# Patient Record
Sex: Female | Born: 1975 | Race: Black or African American | Hispanic: No | Marital: Single | Smoking: Never smoker
Health system: Southern US, Community
[De-identification: ages and names within clinical notes are randomized; demographics above are authoritative.]

## PROBLEM LIST (undated history)

## (undated) DIAGNOSIS — I10 Essential (primary) hypertension: Secondary | ICD-10-CM

## (undated) DIAGNOSIS — I82409 Acute embolism and thrombosis of unspecified deep veins of unspecified lower extremity: Secondary | ICD-10-CM

## (undated) DIAGNOSIS — K449 Diaphragmatic hernia without obstruction or gangrene: Secondary | ICD-10-CM

## (undated) DIAGNOSIS — I81 Portal vein thrombosis: Secondary | ICD-10-CM

## (undated) HISTORY — PX: DILATION AND CURETTAGE OF UTERUS: SHX78

## (undated) HISTORY — PX: APPENDECTOMY: SHX54

## (undated) HISTORY — PX: LAPAROSCOPIC GASTRIC BAND REMOVAL WITH LAPAROSCOPIC GASTRIC SLEEVE RESECTION: SHX6498

---

## 2006-12-05 ENCOUNTER — Ambulatory Visit: Payer: Self-pay | Admitting: Obstetrics & Gynecology

## 2006-12-19 ENCOUNTER — Other Ambulatory Visit: Admission: RE | Admit: 2006-12-19 | Discharge: 2006-12-19 | Payer: Self-pay | Admitting: Obstetrics & Gynecology

## 2006-12-19 ENCOUNTER — Encounter: Payer: Self-pay | Admitting: Obstetrics and Gynecology

## 2006-12-19 ENCOUNTER — Ambulatory Visit: Payer: Self-pay | Admitting: *Deleted

## 2007-03-07 ENCOUNTER — Ambulatory Visit: Payer: Self-pay | Admitting: Obstetrics & Gynecology

## 2007-04-01 ENCOUNTER — Ambulatory Visit: Payer: Self-pay | Admitting: Obstetrics & Gynecology

## 2007-04-01 ENCOUNTER — Encounter: Payer: Self-pay | Admitting: Obstetrics & Gynecology

## 2007-04-01 ENCOUNTER — Ambulatory Visit (HOSPITAL_COMMUNITY): Admission: RE | Admit: 2007-04-01 | Discharge: 2007-04-01 | Payer: Self-pay | Admitting: Obstetrics & Gynecology

## 2007-04-24 ENCOUNTER — Ambulatory Visit: Payer: Self-pay | Admitting: Obstetrics & Gynecology

## 2008-12-10 ENCOUNTER — Ambulatory Visit: Payer: Self-pay | Admitting: Obstetrics and Gynecology

## 2008-12-11 ENCOUNTER — Encounter: Payer: Self-pay | Admitting: Obstetrics & Gynecology

## 2008-12-11 LAB — CONVERTED CEMR LAB
FSH: 5.2 milliintl units/mL
Glucose, Bld: 92 mg/dL (ref 70–99)
LH: 8.2 milliintl units/mL
TSH: 1.458 microintl units/mL (ref 0.350–4.500)

## 2008-12-25 ENCOUNTER — Ambulatory Visit: Payer: Self-pay | Admitting: Obstetrics and Gynecology

## 2010-04-27 ENCOUNTER — Emergency Department (HOSPITAL_BASED_OUTPATIENT_CLINIC_OR_DEPARTMENT_OTHER)
Admission: EM | Admit: 2010-04-27 | Discharge: 2010-04-27 | Payer: Self-pay | Source: Home / Self Care | Admitting: Emergency Medicine

## 2010-04-28 ENCOUNTER — Emergency Department (HOSPITAL_BASED_OUTPATIENT_CLINIC_OR_DEPARTMENT_OTHER)
Admission: EM | Admit: 2010-04-28 | Discharge: 2010-04-28 | Payer: Self-pay | Source: Home / Self Care | Admitting: Emergency Medicine

## 2010-04-30 ENCOUNTER — Emergency Department (HOSPITAL_BASED_OUTPATIENT_CLINIC_OR_DEPARTMENT_OTHER)
Admission: EM | Admit: 2010-04-30 | Discharge: 2010-04-30 | Payer: Self-pay | Source: Home / Self Care | Admitting: Emergency Medicine

## 2010-06-07 ENCOUNTER — Emergency Department (HOSPITAL_BASED_OUTPATIENT_CLINIC_OR_DEPARTMENT_OTHER)
Admission: EM | Admit: 2010-06-07 | Discharge: 2010-06-07 | Payer: Self-pay | Source: Home / Self Care | Admitting: Emergency Medicine

## 2010-06-09 ENCOUNTER — Emergency Department (HOSPITAL_BASED_OUTPATIENT_CLINIC_OR_DEPARTMENT_OTHER)
Admission: EM | Admit: 2010-06-09 | Discharge: 2010-06-09 | Payer: Self-pay | Source: Home / Self Care | Admitting: Emergency Medicine

## 2010-07-10 ENCOUNTER — Emergency Department (HOSPITAL_BASED_OUTPATIENT_CLINIC_OR_DEPARTMENT_OTHER)
Admission: EM | Admit: 2010-07-10 | Discharge: 2010-07-10 | Disposition: A | Discharge status: Other Acute Inpatient Hospital | Payer: Medicaid Other | Attending: Emergency Medicine | Admitting: Emergency Medicine

## 2010-07-10 ENCOUNTER — Emergency Department (INDEPENDENT_AMBULATORY_CARE_PROVIDER_SITE_OTHER): Payer: Medicaid Other

## 2010-07-10 DIAGNOSIS — I10 Essential (primary) hypertension: Secondary | ICD-10-CM | POA: Insufficient documentation

## 2010-07-10 DIAGNOSIS — K219 Gastro-esophageal reflux disease without esophagitis: Secondary | ICD-10-CM | POA: Insufficient documentation

## 2010-07-10 DIAGNOSIS — Z79899 Other long term (current) drug therapy: Secondary | ICD-10-CM | POA: Insufficient documentation

## 2010-07-10 DIAGNOSIS — R079 Chest pain, unspecified: Secondary | ICD-10-CM | POA: Insufficient documentation

## 2010-07-10 LAB — BASIC METABOLIC PANEL
Calcium: 8.3 mg/dL — ABNORMAL LOW (ref 8.4–10.5)
Creatinine, Ser: 0.8 mg/dL (ref 0.4–1.2)
GFR calc non Af Amer: >60 mL/min (ref >60)
Glucose, Bld: 83 mg/dL (ref 70–99)
Sodium: 140 mEq/L (ref 135–145)

## 2010-07-10 LAB — URINALYSIS, ROUTINE W REFLEX MICROSCOPIC
Bilirubin Urine: NEGATIVE
Ketones, ur: NEGATIVE mg/dL
Nitrite: NEGATIVE
Protein, ur: NEGATIVE mg/dL
pH: 6.5 (ref 5.0–8.0)

## 2010-07-10 LAB — CBC
HCT: 40.2 % (ref 36.0–46.0)
Hemoglobin: 13.4 g/dL (ref 12.0–15.0)
MCH: 27.2 pg (ref 26.0–34.0)
MCHC: 33.3 g/dL (ref 30.0–36.0)
MCV: 81.5 fL (ref 78.0–100.0)
Platelets: 213 K/uL (ref 150–400)
RBC: 4.93 MIL/uL (ref 3.87–5.11)
RDW: 13.9 % (ref 11.5–15.5)
WBC: 3.9 K/uL — ABNORMAL LOW (ref 4.0–10.5)

## 2010-07-10 LAB — DIFFERENTIAL
Basophils Relative: 0 % (ref 0–1)
Eosinophils Absolute: 0.1 10*3/uL (ref 0.0–0.7)
Eosinophils Relative: 2 % (ref 0–5)
Lymphs Abs: 1.2 10*3/uL (ref 0.7–4.0)
Monocytes Absolute: 0.5 10*3/uL (ref 0.1–1.0)
Monocytes Relative: 12 % (ref 3–12)
Neutrophils Relative %: 56 % (ref 43–77)

## 2010-07-10 LAB — POCT CARDIAC MARKERS
CKMB, poc: <1 ng/mL — ABNORMAL LOW (ref 1.0–8.0)
Myoglobin, poc: 51.5 ng/mL (ref 12–200)

## 2010-10-04 NOTE — Op Note (Signed)
Brenda Michael, GRANHOLM               ACCOUNT NO.:  [id number]   MEDICAL RECORD NO.:  [id number]          PATIENT TYPE:  AMB   LOCATION:  [facility]                           [facility]:  [facility]   PHYSICIAN:  Norton Blizzard, MD    DATE OF BIRTH:  07-11-1975   DATE OF PROCEDURE:  04/01/2007  DATE OF DISCHARGE:                               OPERATIVE REPORT   PREOPERATIVE DIAGNOSIS:  Dysfunctional uterine bleeding.   POSTOPERATIVE DIAGNOSIS:  Dysfunctional uterine bleeding.   PROCEDURE:  Hysteroscopy, polypectomy, dilation and curettage.   SURGEON:  Norton Blizzard, MD   ASSISTANT:  None.   ANESTHESIA:  General LMA.   IV FLUIDS:  450 mL.   ESTIMATED BLOOD LOSS:  Minimal.   SORBITOL DEFICIT:  160 mL.   INDICATIONS:  The patient is a 35 year old woman with a long history of  dysfunctional uterine bleeding.  The patient had been treated with  Provera in the past with no alleviation of her dysfunctional uterine  bleeding pattern.  The patient did have an endometrial biopsy in July  2008, which showed benign proliferative endometrium.  She also had an  ultrasound which was unremarkable except for an endometrial strip of 15  mm.  In clinic the patient was counseled regarding her options,  including hormonal therapy or surgical measures including a D&C,  endometrial ablation.  The patient opted for a hysteroscopy, D&C.  The  risks of the surgery were explained to the patient, and informed written  consent was obtained.   FINDINGS:  Two small endometrial polyps in the left fundal area,  diffusely proliferative endometrium.  Normal ostia bilaterally.   SPECIMENS:  Endometrial curettings.   DISPOSITION OF SPECIMENS:  To pathology.   COMPLICATIONS:  None.   DESCRIPTION OF PROCEDURE:  The patient was taken to the operating room,  where general anesthesia was placed without difficulty and found to be  adequate.  She was then placed in a dorsal lithotomy position and  prepped and draped in a  sterile manner.  An exam under anesthesia was  done that revealed a small anteverted uterus, no palpable abnormal  masses, and normal adnexae.  At this point the patient's bladder was  catheterized, and a speculum was placed in the patient's vagina.  A  paracervical block was administered using 0.25% Marcaine with  epinephrine, 10 mL were injected on each side.  The cervical canal was  then dilated using Pratt dilators to accommodate a 5-mm hysteroscope.  The hysteroscope was then inserted under direct visualization using  sorbitol as a distention medium.  Bilateral ostia were recognized and  two small polyps were recognized on the left fundal area of the  endometrium, and there was diffusely proliferative endometrium.  The  hysteroscope was then removed at this point and polyp forceps were used  to remove the polyps.  This was followed by further dilation and  curettage until the endometrial cavity was noted to have a gritty  texture.  The hysteroscope was then reintroduced at this time, which  confirmed removal of the polypoid fragments and also confirmed adequate  curettage.  At this point all instruments were then removed from the  patient's vagina.  Overall good hemostasis was noted.  The patient  tolerated the procedure well.  Sponge, lap, needle and instrument counts  were correct x2.  She was taken to the recovery room in stable  condition.      Norton Blizzard, MD  Electronically Signed     UAD/MEDQ  D:  04/01/2007  T:  04/02/2007  Job:  [id number]

## 2010-10-04 NOTE — Group Therapy Note (Signed)
NAME:  Brenda Michael, Brenda Michael NO.:  [id number]   MEDICAL RECORD NO.:  [id number]          PATIENT TYPE:  WOC   LOCATION:  [facility]                   FACILITY:  [facility]   PHYSICIAN:  Wilburt Finlay, M.D.     DATE OF BIRTH:  [birth year]   DATE OF SERVICE:                                  CLINIC NOTE   CHIEF COMPLAINT:  The patient presents for endometrial biopsy.   HISTORY OF PRESENT ILLNESS:  The patient is a 35 year old nulliparous  female who was seen by Dr. Perlie Gold two weeks ago and told to return for  an endometrial biopsy.  She has been having irregular periods for the  last 6 months, about every 2-3 weeks, has been getting heavier.  The  patient also notes this is midline hair on her chin and neck and nothing  on her chest.  She does have some on her groin area about her umbilicus.  The patient reports has been using ibuprofen 800 mg for her menstrual  pain which has helped some but does not seem to take the pain completely  away.   PHYSICAL EXAMINATION:  VITAL SIGNS:  Temperature 97.2, pulse 69, blood  pressure 132/94, weight 283 pounds.  GENERAL APPEARANCE:  Anxious.  ABDOMEN:  Soft, obese, no palpable masses were felt.  PELVIC:  External genitalia appeared normal.  Vaginal mucosa was normal.  Cervix had several Nabothian cysts present.  On bimanual exam, uterus  appeared small, mobile, anteverted.  No adnexal masses or tenderness.  No cervical motion tenderness.   Endometrial biopsy was done.  Cervix was cleaned with Betadine.  Tenaculum applied to the anterior lip of the cervix.  Uterus sounded at  8 cm.  Catheter was passed into the uterus to the fundus without  difficulty and sample obtained.  Two passes were made with adequate  sample specimen received.  The patient tolerated the procedure well.   IMPRESSION:  Dysfunctional uterine bleeding.  There is a possibility  patient may be developing polycystic ovarian syndrome.   PLAN:  1. Endometrial biopsy done  today.  The patient will call for results      in 2-3 weeks.  2. We will attempt to cycle patient for the next 3 months using      Provera.  She was given a script for Provera 10 mg to take one      daily for the first 10 days of each month.  3. The patient is to return to clinic in 3 months.   The plan was discussed with Dr. Okey Dupre.           ______________________________  Wilburt Finlay, M.D.     LJ/MEDQ  D:  12/19/2006  T:  12/20/2006  Job:  [id number]

## 2010-10-04 NOTE — Group Therapy Note (Signed)
NAME:  Brenda Michael, Brenda Michael NO.:  [id number]   MEDICAL RECORD NO.:  [id number]          PATIENT TYPE:  WOC   LOCATION:  [facility]                   FACILITY:  [facility]   PHYSICIAN:  Johnella Moloney, MD        DATE OF BIRTH:  1976-03-13   DATE OF SERVICE:  03/07/2007                                  CLINIC NOTE   CHIEF COMPLAINT:  Followup for a dysfunctional uterine bleeding.   HISTORY OF PRESENT ILLNESS:  The patient is a 35 year old nulliparous  female who has been followed in this clinic for dysfunctional uterine  bleeding.  Please refer to Dr. Wilburt Finlay and Dr. Tennis Must note for  further details.  The patient has been having this bleeding for about 9  months, and had a pelvic ultrasound which showed thickened endometrial  stripe measuring 13 mm in June 2008.  In July 2008 she had an  endometrial biopsy which was remarkable for benign proliferative  endometrium, no hyperplasia or malignancy identified.  At her last visit  in July, the patient was prescribed Provera to see if her cycles could  be regulated.  With the Provera, the patient reports today that she did  take the Provera for the past 3 months, 10 mg daily for 10 days each  month, but had noticed that this did not help in regulating her periods  and also made her bleeding worse.  For the last 3 months she just says  she bleeds about every 2 weeks with heavy bleeding, clots, and crampy  abdominal pain.  Her periods last more than 7 days.  The patient is very  cheerful and wants to know what else she could do about this  dysfunctional uterine bleeding.   PHYSICAL EXAMINATION:  Deferred at this visit.   IMPRESSION:  The patient is a 35 year old with dysfunctional uterine  bleeding.  She discussed options of using hormonal therapy such as oral  contraceptive pills for regulation of her periods.  However, the patient  is interested in getting pregnant soon, and is not interested in this  mode.  She also declines  Mirena IUD as a way to stabilize her  endometrial stripe, and also reduce the episodes of bleeding.  The  patient is interested in obtaining hysteroscopy and dilatation and  curettage for further evaluation.  She reports that her sister had a  similar issue and this was ameliorated by a dilation and curettage.  The  patient was counseled regarding the risks of a dilation and curettage,  including the risk of Asherman's syndrome which could cause problems  with future fertility.  The patient is very much interested in pregnancy  and does not want any further surgical procedures such as an endometrial  ablation or hysterectomy.  She is simply interested in a dilation and  curettage.  Will go ahead and schedule the patient for hysteroscopy,  dilation and curettage.  In the meantime, she will use Provera 10 mg  p.o. b.i.d. right now as a temporary measure to stabilize her  endometrium until this procedure can be booked.  ______________________________  Johnella Moloney, MD     UD/MEDQ  D:  03/07/2007  T:  03/08/2007  Job:  [id number]

## 2010-10-04 NOTE — Group Therapy Note (Signed)
NAME:  Brenda Michael, DRISCOLL NO.:  [id number]   MEDICAL RECORD NO.:  [id number]          PATIENT TYPE:  WOC   LOCATION:  [facility]                   FACILITY:  [facility]   PHYSICIAN:  Dorthula Perfect, MD     DATE OF BIRTH:  [birth year]   DATE OF SERVICE:  12/05/2006                                  CLINIC NOTE   [facility]   This 35 year old black female, nulligravida, is being seen here for  abnormal uterine bleeding that has been going on for about the past  year.  She was seen by her family doctor who sent her to the outpatient  clinic at [facility], where she was seen the beginning  of June this year.  They obtained an ultrasound, which revealed normal  uterus and ovaries.  The endometrial stripe was 13 mm.   For the past year her menstrual periods have been more or less  irregular.  She will have spotting for 3 to 7 days followed by heavy  flow for about 7 days.  The flow is heavy enough to stain her clothing  and bed sheets.  She also has had a lot of cramping, and she will have  no bleeding for about a week, and then this will happen again.  The last  episode of bleeding started July 9.  The rest of her history is okay.   PHYSICAL EXAM:  Height 5 feet 11 inches, weight 283 pounds.  Blood  pressure is not recorded.  ABDOMEN:  Obese.  No masses are felt.  PELVIC:  External genitalia and BUS glands normal.  Vaginal vault was  epithelialized, as was the cervix.  The uterus is in the midline and is  of normal size and shape.  Adnexal structures are normal.   IMPRESSION:  Abnormal uterine bleeding.   DISPOSITION:  1. The patient will return in 2 weeks for an endometrial biopsy.      Hopefully, this will coincide with a luteal phase of her cycle, but      if not, biopsies should probably be done anyway.  Then she will      need to cycle for at least the next 3 months.  2. I have also discussed with her, her weight problem and  encouraged      her to switch off her food intake to unsweetened drinks.  I have      also asked her to reduce her sweets and to cut back on white bread      and potatoes.           ______________________________  Dorthula Perfect, MD     ER/MEDQ  D:  12/05/2006  T:  12/06/2006  Job:  [id number]

## 2010-10-04 NOTE — Group Therapy Note (Signed)
NAME:  Brenda Michael, Brenda Michael NO.:  [id number]   MEDICAL RECORD NO.:  [id number]          PATIENT TYPE:  WOC   LOCATION:  [facility]                   [facility]   PHYSICIAN:  Scheryl Darter, MD       DATE OF BIRTH:  July 24, 1975   DATE OF SERVICE:  12/10/2008                                  CLINIC NOTE   HISTORY OF PRESENT ILLNESS:  The patient returns today with irregular  periods.  The patient is a 35 year old black female, gravida 1, para 0-1-  0-0.  Last menstrual period December 07, 2008.  He says her last few menses  had been quite irregular and abnormal in that they only last for 1-2  days.  She has been cared for by [facility] in [facility] until  earlier in the year when she lost her insurance.  She has been on Ortho  Evra, which gave her good cycle control, but she started to have  symptoms of headaches and numbness and then felt perhaps she was having  mini strokes.  She sees a neurologist now.  The patient had a  dilatation curettage in November 2008 due to dysfunctional bleeding and  showed normal secretory endometrium and benign polyps.  Since then she  conceived, but she had severe preeclampsia and delivered at about 6  months and the infant subsequently died due to prematurity.  She is  somewhat ambivalent about conceiving now, but she does wish to conceive  in the future.  She is not currently using any contraceptives.   PAST MEDICAL HISTORY:  1. Obesity.  2. Severe preeclampsia.  3. High cholesterol.   FAMILY HISTORY:  Diabetes, heart disease, hypertension, and blood clots.   MEDICATIONS:  1. Acetazolamide 500 mg p.o. daily or b.i.d. p.r.n.  2. Zantac 150 mg p.o. b.i.d. times p.o. p.r.n.  3. Amitriptyline 1-2 p.o. nightly.  4. Prenatal vitamins one a day.  5. Vitamin C 500 mg a day.   ALLERGIES:  She has no known drug allergies.   PAST SURGICAL HISTORY:  None.   PHYSICAL EXAMINATION:  GENERAL:  The patient in no acute distress.  Affect is  normal.  VITAL SIGNS:  Height is 71 inches, weight 282.5 pounds, BP 142/88, pulse  89, temperature 97.4.  ABDOMEN:  Obese, soft, nontender, no mass.  PELVIC:  External genitalia, vagina, and cervix shows some slight  discharge, and some clear cervical mucus with spinnbarkeit.  Uterus  normal size, nontender, no mass.  Wet prep was sent today.   IMPRESSION:  Irregular periods, possibly anovulatory.  The patient has  been diagnosed with complication from using steroid contraception.  She  is borderline hypertensive.   PLAN:  TSH, LH, FSH, prolactin, random glucose.  The patient return in 2  weeks to review her results.  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  To seriously consider whether she should be on steroids  for cycle control.      Scheryl Darter, MD     JA/MEDQ  D:  12/10/2008  T:  12/11/2008  Job:  [id number]

## 2011-02-28 LAB — CBC
Hemoglobin: 12
Platelets: 256
RDW: 16.1 — ABNORMAL HIGH

## 2011-02-28 LAB — HCG, QUANTITATIVE, PREGNANCY: hCG, Beta Chain, Quant, S: <2

## 2011-03-06 ENCOUNTER — Emergency Department (HOSPITAL_BASED_OUTPATIENT_CLINIC_OR_DEPARTMENT_OTHER)
Admission: EM | Admit: 2011-03-06 | Discharge: 2011-03-06 | Disposition: A | Discharge status: Home / Self Care | Payer: Self-pay | Attending: Emergency Medicine | Admitting: Emergency Medicine

## 2011-03-06 ENCOUNTER — Other Ambulatory Visit: Payer: Self-pay

## 2011-03-06 ENCOUNTER — Emergency Department (INDEPENDENT_AMBULATORY_CARE_PROVIDER_SITE_OTHER): Payer: Self-pay

## 2011-03-06 ENCOUNTER — Encounter: Payer: Self-pay | Admitting: *Deleted

## 2011-03-06 DIAGNOSIS — I10 Essential (primary) hypertension: Secondary | ICD-10-CM

## 2011-03-06 DIAGNOSIS — R079 Chest pain, unspecified: Secondary | ICD-10-CM | POA: Insufficient documentation

## 2011-03-06 DIAGNOSIS — I517 Cardiomegaly: Secondary | ICD-10-CM

## 2011-03-06 DIAGNOSIS — K219 Gastro-esophageal reflux disease without esophagitis: Secondary | ICD-10-CM | POA: Insufficient documentation

## 2011-03-06 DIAGNOSIS — R55 Syncope and collapse: Secondary | ICD-10-CM | POA: Insufficient documentation

## 2011-03-06 HISTORY — DX: Diaphragmatic hernia without obstruction or gangrene: K44.9

## 2011-03-06 HISTORY — DX: Essential (primary) hypertension: I10

## 2011-03-06 LAB — URINE MICROSCOPIC-ADD ON

## 2011-03-06 LAB — COMPREHENSIVE METABOLIC PANEL
ALT: 9 U/L (ref 0–35)
AST: 12 U/L (ref 0–37)
Albumin: 3.4 g/dL — ABNORMAL LOW (ref 3.5–5.2)
Alkaline Phosphatase: 81 U/L (ref 39–117)
Calcium: 8.7 mg/dL (ref 8.4–10.5)
Potassium: 3.5 mEq/L (ref 3.5–5.1)
Sodium: 139 mEq/L (ref 135–145)
Total Protein: 7.2 g/dL (ref 6.0–8.3)

## 2011-03-06 LAB — CBC
Hemoglobin: 11.5 g/dL — ABNORMAL LOW (ref 12.0–15.0)
MCH: 27.5 pg (ref 26.0–34.0)
MCHC: 32.4 g/dL (ref 30.0–36.0)
Platelets: 226 10*3/uL (ref 150–400)
RDW: 13.8 % (ref 11.5–15.5)

## 2011-03-06 LAB — POCT PREGNANCY, URINE
Operator id: 234181
Preg Test, Ur: NEGATIVE

## 2011-03-06 LAB — CARDIAC PANEL(CRET KIN+CKTOT+MB+TROPI)
CK, MB: 2.7 ng/mL (ref 0.3–4.0)
Relative Index: 1.5 (ref 0.0–2.5)
Troponin I: <0.3 ng/mL (ref <0.30)

## 2011-03-06 LAB — URINALYSIS, ROUTINE W REFLEX MICROSCOPIC
Ketones, ur: 15 mg/dL — AB
Protein, ur: 30 mg/dL — AB
Urobilinogen, UA: 1 mg/dL (ref 0.0–1.0)

## 2011-03-06 MED ORDER — SODIUM CHLORIDE 0.9 % IV BOLUS (SEPSIS)
1000.0000 mL | Freq: Once | INTRAVENOUS | Status: AC
  Administered 2011-03-06: 1000 mL via INTRAVENOUS

## 2011-03-06 MED ORDER — IBUPROFEN 800 MG PO TABS
800.0000 mg | ORAL_TABLET | Freq: Once | ORAL | Status: AC
  Administered 2011-03-06: 800 mg via ORAL
  Filled 2011-03-06: qty 1

## 2011-03-06 NOTE — ED Notes (Signed)
Spoke with Dr Karma Ganja regarding pt's LOS and her desire to leave ASAP. MD states she will go discuss plan of care with pt.

## 2011-03-06 NOTE — ED Notes (Signed)
Pt reports she awakened this am not feeling well.  As the morning progressed, she felt worse with dizziness and "feeling hot".  She reports onset of midsternal chest pain that started at 12:00 today.  Pain is described as sharp and is reproduceable upon palpation.  Denies SHOB, nausea, radiation.  She chronically belches and states she has a hernia and severe reflux.

## 2011-03-06 NOTE — ED Notes (Signed)
Pt c/o central chest pain with dizziness x 6 hrs.

## 2011-03-06 NOTE — ED Notes (Signed)
Pt ambulatory to bathroom.  Pt reports a headache.

## 2011-03-06 NOTE — ED Notes (Signed)
MD at bedside. 

## 2011-03-06 NOTE — ED Provider Notes (Addendum)
History     CSN: [id number] Arrival date & time: 03/06/2011  5:54 PM  Chief Complaint  Patient presents with  . Chest Pain    (Consider location/radiation/quality/duration/timing/severity/associated sxs/prior treatment) HPI Patient presents with complaint of pain in the middle of her chest as well as dizziness and near syncope today. States that she's had reflux worsening of the past several months associated with belching and centralized chest pain. The chest pain is continuous and not significantly changed today.  No vomiting.  Denies recent fevers, has been eating and drinking normally.  States she had multiple tests done several days ago including abdominal u/s which she does not know the results of these tests.  The new symptom today is that she dizzy and flushed with near syncope.   Past Medical History  Diagnosis Date  . Hypertension   . Hernia, hiatal     Past Surgical History  Procedure Date  . Appendectomy   . Dilation and curettage of uterus     History reviewed. No pertinent family history.  History  Substance Use Topics  . Smoking status: Never Smoker   . Smokeless tobacco: Not on file  . Alcohol Use: No    OB History    Grav Para Term Preterm Abortions TAB SAB Ect Mult Living                  Review of Systems ROS reviewed and otherwise negative except for mentioned in HPI  Allergies  Ivp dye  Home Medications   Current Outpatient Rx  Name Route Sig Dispense Refill  . PRESCRIPTION MEDICATION Oral Take 1 tablet by mouth daily. Unknown blood pressure pill     . PRESCRIPTION MEDICATION Oral Take 1 tablet by mouth at bedtime. Unknown anxiety pill       BP 148/105  Pulse 70  Temp 97.9 F (36.6 C)  Resp 20  Ht 5\' 11"  (1.803 m)  Wt 278 lb (126.1 kg)  BMI 38.77 kg/m2  SpO2 100%  LMP 03/05/2011 Vitals reviewed Physical Exam Physical Examination: General appearance - alert, well appearing, and in no distress Mental status - alert, oriented to  person, place, and time Mouth - mucous membranes moist, pharynx normal without lesions Chest - clear to auscultation, no wheezes, rales or rhonchi, symmetric air entry Heart - normal rate, regular rhythm, normal S1, S2, no murmurs, rubs, clicks or gallops Abdomen - soft, nontender, nondistended, no masses or organomegaly Musculoskeletal - no joint tenderness, deformity or swelling Extremities - peripheral pulses normal, no pedal edema, no clubbing or cyanosis Skin - normal coloration and turgor, no rashes, no suspicious skin lesions noted   ED Course  Procedures (including critical care time)  Date: 03/06/2011  Rate: 76  Rhythm: normal sinus rhythm  QRS Axis: normal  Intervals: normal  ST/T Wave abnormalities: nonspecific T wave changes, twi in lead III which was present on old ekg  Conduction Disutrbances:none  Narrative Interpretation:   Old EKG Reviewed: no significant changes    Labs Reviewed  CBC - Abnormal; Notable for the following:    Hemoglobin 11.5 (*)    HCT 35.5 (*)    All other components within normal limits  COMPREHENSIVE METABOLIC PANEL - Abnormal; Notable for the following:    Albumin 3.4 (*)    Total Bilirubin 0.2 (*)    All other components within normal limits  CARDIAC PANEL(CRET KIN+CKTOT+MB+TROPI) - Abnormal; Notable for the following:    Total CK 178 (*)    All other  components within normal limits  URINALYSIS, ROUTINE W REFLEX MICROSCOPIC - Abnormal; Notable for the following:    Color, Urine RED (*) BIOCHEMICALS MAY BE AFFECTED BY COLOR   Appearance TURBID (*)    Hgb urine dipstick LARGE (*)    Ketones, ur 15 (*)    Protein, ur 30 (*)    Leukocytes, UA MODERATE (*)    All other components within normal limits  URINE MICROSCOPIC-ADD ON - Abnormal; Notable for the following:    Squamous Epithelial / LPF FEW (*)    Bacteria, UA FEW (*)    All other components within normal limits  LIPASE, BLOOD  PREGNANCY, URINE   No results found.   No  diagnosis found.    MDM  Pt with several months of belching, central chest pain, diagnosed with reflux by PMD.  Today with c/o near syncope and continued chest pain and belching.  Labs obtained and reassuring- mild anemia, RBCs in urine- pt currently on menses.  EKG reassuring as well.  We are attempting to get records from [facility] as pt had abdominal ultrasound approx 3-4 days ago per her report.  Also awaiting second set of cardiac markers which will be due at 10pm 9:30 PM Records obtained from [facility]- abdominal ultrasound dated 10/12 was normal as well as Upper GI which showed mild reflux without reflux esophagitis. 10:08 PM Results discussed with patient, she has follow up appointment with her PMD- no acute findings that warrant admission or further workup in the ED at this time.  Discharged with strict return precautions.  Pt agreeable with plan. Nontoxic and well hydrated appearing       Ethelda Chick, MD 03/10/11 1540  Ethelda Chick, MD 03/16/11 [phone]

## 2011-03-06 NOTE — ED Notes (Signed)
Pt ambulated to restroom with a steady gait, no resp diff noted.

## 2011-04-24 ENCOUNTER — Ambulatory Visit (INDEPENDENT_AMBULATORY_CARE_PROVIDER_SITE_OTHER): Payer: Self-pay | Admitting: Advanced Practice Midwife

## 2011-04-24 ENCOUNTER — Other Ambulatory Visit: Payer: Self-pay | Admitting: Obstetrics & Gynecology

## 2011-04-24 ENCOUNTER — Encounter: Payer: Self-pay | Admitting: Advanced Practice Midwife

## 2011-04-24 DIAGNOSIS — I10 Essential (primary) hypertension: Secondary | ICD-10-CM | POA: Insufficient documentation

## 2011-04-24 DIAGNOSIS — N644 Mastodynia: Secondary | ICD-10-CM

## 2011-04-24 DIAGNOSIS — N643 Galactorrhea not associated with childbirth: Secondary | ICD-10-CM | POA: Insufficient documentation

## 2011-04-24 NOTE — Progress Notes (Signed)
Pt states has brown discharge 3 to 4 days prior to menstrual cycle then has brown discharge after cycle 2 to 3 days.

## 2011-04-24 NOTE — Progress Notes (Signed)
  Subjective:    Patient ID: Brenda Michael, female    DOB: June 09, 1975, 35 y.o.   MRN: [id number]  HPI Presents with c/o bilateral galactorrhea and bilateral breast pain. Is followed by her Medical Dr Daphine Deutscher for hypertension and H. Pylori. States has had breast, chest and back pain related to pendulous breasts and is worried she has cancer. Also reports intermittent galactorrhea in small drops. Stopped breastfeeding a year ago. Has had frequent UPTs that are negative, most recently last week. Has never had a mammogram.  Prolactin last week was normal.    Review of Systems As above    Objective:   Physical Exam  Constitutional: She is oriented to person, place, and time. She appears well-developed and well-nourished.  HENT:  Head: Normocephalic.  Cardiovascular: Normal rate.   Pulmonary/Chest: Effort normal.       Bilateral breast exam normal. Breasts are large and pendulous. No dominant masses. No erethema. No nipple discharge can be expressed today.   Abdominal: Soft.  Musculoskeletal: Normal range of motion.  Neurological: She is alert and oriented to person, place, and time.  Skin: Skin is warm and dry.  Psychiatric: She has a normal mood and affect.       anxious          Assessment & Plan:  A: Galactorrhea of unknown etiology, not in evidence today     Gynecomastia     Mastodynia P:  Discussed normal Prolactin and normal breast exam.       Will get Mammo and/or [place] to reassure      Discussed chest and back strength training to increase ability to carry breast weight      Discussed only other way to treat this is through breast reduction.  Discussed expense. Pt has no insurance yet, may get it at work Will investigate [employer].        Return 2 weeks for results.

## 2011-04-24 NOTE — Progress Notes (Signed)
Addended by: Aviva Signs on: 04/24/2011 03:13 PM   Modules accepted: Orders

## 2011-04-24 NOTE — Patient Instructions (Signed)
Breast Reduction  Reduction mammoplasty is surgery to reduce the size of the breasts. To do this, a surgeon removes fat, tissue and excess skin from the breasts. There are some common reasons for reduction mammoplasty. In some women, large breasts contribute to poor posture or chronic neck and back pain. Sometimes a rash develops under the breasts. Large breasts can also make it difficult to exercise. Before the procedure, you and your surgeon will decide on a new size and shape for your breasts. After breast reduction, the breasts will be smaller. This should make movement easier. This surgery should also relieve discomfort that large breasts might have caused.  LET YOUR CAREGIVER KNOW ABOUT:  Allergies.   All medications you take. This includes herbal supplements, eye drops, over-the-counter medicines, creams, and prescription drugs.   Any use of steroids (by mouth or in creams).   Previous problems with anesthetics.   Possibility of pregnancy, if this applies.   History of blood clots or bleeding problems.   Previous surgery.   Other health problems.  RISKS AND COMPLICATIONS  Excessive bleeding.   Blood clots in your legs or lungs.   Pool of blood forming near the wound (hematoma). This is caused by a broken blood vessel.   Infection.   Slow healing.   Pain. You might feel shooting pains in your breasts.   Total or partial loss of feeling in your breasts or nipples. Discuss this with your surgeon before the operation.   Scarring.   Damage to your areola (dark area around the nipple).   Uneven breasts after healing.   Loss of ability to breast-feed.   Pneumonia (disease of the lungs). This is a risk with many surgeries.  BEFORE THE PROCEDURE  As the surgery date nears:   Avoid aspirin, ibuprofen or herbal supplements for 2 weeks before surgery.   Do not take vitamin E for 2 weeks before surgery.   Quit smoking. Smoking makes healing more difficult. It also makes  scarring more likely.   If you are older than 35, your surgeon might want you to have a mammogram (breast X-ray) before surgery.   Let your surgeon know if you develop a fever or a cold. You want to be well for your procedure.   Right before surgery:   Stop eating or drinking any fluids for 8 hours before your surgery.   You might be asked to shower with a special antibacterial soap, before coming in for the procedure.   If your surgery is an outpatient procedure, you will go home the same day. Someone responsible should come with you to receive discharge instructions and drive you home.  PROCEDURE Most breast reduction surgeries are done under general anesthesia. This means that you will be asleep during the procedure. The surgery usually takes 2 to 4 hours.  Before the procedure begins, the surgeon will make some marks on your breasts. These will show where the cuts (incisions) will be made. During the procedure, the surgeon will first remove fat, tissue, and excess skin. Then the surgeon will start to reshape your breasts. The nipples also might be removed, and then replaced, when the reshaping begins. The incisions will be closed with tiny stitches. The surgeon might place some small tubes under the skin, to allow the wound to drain for a few days. You will be taught how to care for the drains. At the end of the surgery, your breasts will be bound securely with gauze and elastic bandages. This will  protect them for the first few days, so they can heal.  AFTER THE PROCEDURE You will be taken to a recovery area. There, a nurse or other health care worker will watch your progress. Your vital signs (heart beat, breathing rate, temperature, blood pressure) will be checked. If you had outpatient surgery, you will be able to go home when you are stable and able to drink fluids. If you had inpatient surgery, you will be taken to your room.  Before you go home, make sure someone has clearly explained  how to take care of yourself at home. Also, be sure you know when to come back for a follow up visit. You might be given a special bra to wear while your breasts heal. HOME CARE INSTRUCTIONS   If needed for pain or nausea, take prescription medicines that your caregiver prescribes.   Only take over-the-counter medicines for pain or fever if your caregiver says it is okay. Do not take aspirin or ibuprofen. That increases the chances of a bleeding problem.   If antibiotics are prescribed, finish them.   Carefully follow the instructions about caring for your incisions. If drains were put in, they will be taken out by the surgeon in a few days. Stitches are usually taken out in 7 to 10 days.   Rest for the first few days after surgery. However, get up and walk around several times in the morning and evening. Moving around will help prevent blood clots and pneumonia.   Do not shower or take a bath for a few days after surgery. Take a sponge bath instead.   For 3 weeks after surgery, do not lift anything that weighs more than 5 pounds.   Limit driving until you are not taking narcotic pain medicines. They can make you sleepy.   Do not do heavy physical work for 1 month. This includes yard work and Insurance claims handler.   How soon you can return to work depends on what type of work you do. For instance, if you have a desk job, you may be back to work in 2 weeks. If you do manual labor, it may take longer.  Remember, it takes months for your breasts to start to look "normal" again. SEEK MEDICAL CARE IF:   The area around the incisions becomes red or swollen, pain increases, or fluid oozes from any of the wounds.   The pain medication that was recommended is not controlling your pain.   You feel sick to your stomach (nauseous) or you throw up (vomit) for more than 2 days after surgery.   You develop a cough.   Moving your arm is difficult or painful.   You see pockets of blood or fluid in  either breast.  SEEK IMMEDIATE MEDICAL CARE IF:   You have a fever.   You have pain in your chest.   Your legs or arms start to swell.   You are suddenly short of breath.  Document Released: 08/04/2008 Document Revised: 01/18/2011 Document Reviewed: 08/04/2008 [facility] Patient Information 2012 [place], [place].Breast Tenderness Breast tenderness is a common complaint made by women of all ages. It is also called mastalgia or mastodynia, which means breast pain. The condition can range from mild discomfort to severe pain. It has a variety of causes. Your caregiver will find out the likely cause of your breast tenderness by examining your breasts, asking you about symptoms and perhaps ordering some tests. Breast tenderness usually does not mean you have breast cancer.  CAUSES  Breast tenderness has many possible causes. They include:  Premenstrual changes. A week to 10 days before your period, your breasts might ache or feel tender.   Other hormonal causes. These include:   When sexual and physical traits mature (puberty).   Pregnancy.   The time right before and the year after menopause (perimenopause).   The day when it has been 12 months since your last period (menopause).   Large breasts.   Infection (also called mastitis).   Birth control pills.   Breastfeeding. Tenderness can occur if the breasts are overfull with milk or if a milk duct is blocked.   Injury.   Fibrocystic breast changes. This is not cancer (benign). It causes painful breasts that feel lumpy.   Fluid-filled sacs (cysts). Often cysts can be drained in your healthcare provider's office.   Fibroadenoma. This is a tumor that is not cancerous.   Medication side effects. Blood pressure drugs and diuretics (which increase urine flow) sometimes cause breast tenderness.   Previous breast surgery, such as a breast reduction.   Breast cancer. Cancer is rarely the reason breasts are tender. In most women,  tenderness is caused by something else.  DIAGNOSIS  Several methods can be used to find out why your breasts are tender. They include:  Visual inspection of the breasts.   Examination by hand.   Tests, such as:   Mammogram.   Ultrasound.   Biopsy.   Lab test of any fluid coming from the nipple.   Blood tests.   MRI.  TREATMENT  Treatment is directed to the cause of the breast tenderness from doing nothing for minor discomfort, wearing a good support bra but also may include:  Taking over-the-counter medicines for pain or discomfort as directed by your caregiver.   Prescription medicine for breast tenderness related to:   Premenstrual.   Fibrocystic.   Puberty.   Pregnancy.   Menopause.   Previous breast surgery.   Large breasts.   Antibiotics for infection.   Birth control pills for fibrocystic and premenstrual changes.   More frequent feedings or pumping of the breasts and warm compresses for breast engorgement when nursing.   Cold and warm compresses and a good support bra for most breast injuries.   Breast cysts are sometimes drained with a needle (aspiration) or removed with minor surgery.   Fibroadenomas are usually removed with minor surgery.   Changing or stopping the medicine when it is responsible for causing the breast tenderness.   When breast cancer is present with or without causing pain, it is usually treated with major surgery (with or without radiation) and chemotherapy.  HOME CARE INSTRUCTIONS  Breast tenderness often can be handled at home. You can try:  Getting fitted for a new bra that provides more support, especially during exercise.   Wearing a more supportive or sports bra while sleeping when your breasts are very tender.   If you have a breast injury, using an ice pack for 15 to 20 minutes. Wrap the pack in a towel. Do not put the ice pack directly on your breast.   If your breasts are too full of milk as a result of  breastfeeding, try:   Expressing milk either by hand or with a breast pump.   Applying a warm compress for relief.   Taking over-the-counter pain relievers, if this is OK with your caregiver.   Taking medicine that your caregiver prescribes. These might include antibiotics or birth control pills.  Over the long term, your breast tenderness might be eased if you:  Cut down on caffeine.   Reduce the amount of fat in your diet.  Also, learn how to do breast examinations at home. This will help you tell when you have an unusual growth or lump that could cause tenderness. And keep a log of the days and times when your breasts are most tender. This will help you and your caregiver find the right solution. SEEK MEDICAL CARE IF:   Any part of your breast is hard, red and hot to the touch. This could be a sign of infection.   Fluid is coming out of your nipples (and you are not breastfeeding). Especially watch for blood or pus.   You have a fever as well as breast tenderness.   You have a new or painful lump in your breast that remains after your period ends.   You have tried to take care of the pain at home, but it has not gone away.   Your breast pain is getting worse. Or, the pain is making it hard to do the things you usually do during your day.  Document Released: 04/20/2008 Document Revised: 01/18/2011 Document Reviewed: 04/20/2008 [facility] Patient Information 2012 [place], [place].

## 2011-05-09 ENCOUNTER — Other Ambulatory Visit: Payer: Self-pay | Admitting: Obstetrics and Gynecology

## 2011-05-12 ENCOUNTER — Other Ambulatory Visit: Payer: Self-pay | Admitting: Obstetrics & Gynecology

## 2011-05-12 ENCOUNTER — Ambulatory Visit
Admission: RE | Admit: 2011-05-12 | Discharge: 2011-05-12 | Disposition: A | Discharge status: Home / Self Care | Payer: Self-pay | Source: Ambulatory Visit | Attending: Obstetrics & Gynecology | Admitting: Obstetrics & Gynecology

## 2011-05-17 ENCOUNTER — Telehealth: Payer: Self-pay | Admitting: *Deleted

## 2011-05-17 NOTE — Telephone Encounter (Signed)
Called pt and left message that we are calling regarding results. She has an appt to discuss results on 06/05/11 @ 1600 and does not need to return our call. However, if she would like information prior to 1/14, she may call and leave message on the nurse voice mail.

## 2011-05-17 NOTE — Telephone Encounter (Signed)
Message copied by Jill Side on Wed May 17, 2011  9:44 AM ------      Message from: Aviva Signs      Created: Fri May 12, 2011 10:51 PM      Regarding: Needs repeat Mammo in 6 months       Seen for breast pain, had mammp       Benign lesion, but they recommend followup Mammo in 6 mos            How do we do this?  Order it now?

## 2011-06-05 ENCOUNTER — Ambulatory Visit: Payer: Self-pay | Admitting: Obstetrics & Gynecology

## 2011-06-14 ENCOUNTER — Telehealth: Payer: Self-pay | Admitting: *Deleted

## 2011-06-14 NOTE — Telephone Encounter (Addendum)
Spoke w/pt. She states that she knows she will need appt in June for repeat diagnostic mammo. She also wanted to know if she will need her follow up appt on 06/23/11 since she is already aware of the results. I stated that I will check with the doctor and call her back tomorrow. I called [facility] since I did not see that pt had an appt scheduled. I was informed that they send reminder letters to pts regarding the need for follow up appt.

## 2011-06-14 NOTE — Telephone Encounter (Signed)
Message copied by Jill Side on Wed Jun 14, 2011 11:33 AM ------      Message from: Lesly Dukes      Created: Tue Jun 13, 2011  4:04 PM       Pt had diagnostic mammogram in December.  Needs another in June.  Please let me know when it is scheduled.

## 2011-06-14 NOTE — Telephone Encounter (Signed)
Pt left message that she is returning call.

## 2011-06-14 NOTE — Telephone Encounter (Signed)
Called pt and left message that I was calling in regards to her recent mammogram. I requested pt to call back and leave voice mail of when she can be reached.  *Note: Find out if pt is aware of mammo results and what instructions she was given for scheduling repeat diagnostic mammo in June. Notify Dr. Penne Lash of pt's appt - currently is not scheduled.

## 2011-06-20 NOTE — Telephone Encounter (Signed)
Called pt after speaking with Dr. Penne Lash- message left that she will not need the appt on 06/23/11 and I will cancel it for her.

## 2011-06-23 ENCOUNTER — Ambulatory Visit: Payer: Self-pay | Admitting: Obstetrics & Gynecology

## 2012-01-12 ENCOUNTER — Other Ambulatory Visit: Payer: Self-pay | Admitting: Advanced Practice Midwife

## 2012-01-12 DIAGNOSIS — R922 Inconclusive mammogram: Secondary | ICD-10-CM

## 2012-02-16 ENCOUNTER — Other Ambulatory Visit: Payer: Self-pay | Admitting: Advanced Practice Midwife

## 2012-02-16 ENCOUNTER — Ambulatory Visit
Admission: RE | Admit: 2012-02-16 | Discharge: 2012-02-16 | Disposition: A | Discharge status: Home / Self Care | Payer: BC Managed Care – PPO | Source: Ambulatory Visit | Attending: Advanced Practice Midwife | Admitting: Advanced Practice Midwife

## 2012-02-16 DIAGNOSIS — R922 Inconclusive mammogram: Secondary | ICD-10-CM

## 2012-02-16 DIAGNOSIS — R9230 Dense breasts, unspecified: Secondary | ICD-10-CM

## 2013-08-08 ENCOUNTER — Emergency Department (HOSPITAL_BASED_OUTPATIENT_CLINIC_OR_DEPARTMENT_OTHER)
Admission: EM | Admit: 2013-08-08 | Discharge: 2013-08-08 | Disposition: A | Discharge status: Home / Self Care | Payer: BC Managed Care – PPO | Attending: Emergency Medicine | Admitting: Emergency Medicine

## 2013-08-08 ENCOUNTER — Encounter (HOSPITAL_BASED_OUTPATIENT_CLINIC_OR_DEPARTMENT_OTHER): Payer: Self-pay | Admitting: Emergency Medicine

## 2013-08-08 DIAGNOSIS — R110 Nausea: Secondary | ICD-10-CM | POA: Insufficient documentation

## 2013-08-08 DIAGNOSIS — Z8719 Personal history of other diseases of the digestive system: Secondary | ICD-10-CM | POA: Insufficient documentation

## 2013-08-08 DIAGNOSIS — Z3202 Encounter for pregnancy test, result negative: Secondary | ICD-10-CM | POA: Insufficient documentation

## 2013-08-08 DIAGNOSIS — R519 Headache, unspecified: Secondary | ICD-10-CM

## 2013-08-08 DIAGNOSIS — I10 Essential (primary) hypertension: Secondary | ICD-10-CM | POA: Insufficient documentation

## 2013-08-08 DIAGNOSIS — Z79899 Other long term (current) drug therapy: Secondary | ICD-10-CM | POA: Insufficient documentation

## 2013-08-08 DIAGNOSIS — R51 Headache: Secondary | ICD-10-CM | POA: Insufficient documentation

## 2013-08-08 LAB — PREGNANCY, URINE: Preg Test, Ur: NEGATIVE

## 2013-08-08 MED ORDER — METOCLOPRAMIDE HCL 5 MG/ML IJ SOLN
10.0000 mg | Freq: Once | INTRAMUSCULAR | Status: AC
  Administered 2013-08-08: 10 mg via INTRAVENOUS
  Filled 2013-08-08: qty 2

## 2013-08-08 MED ORDER — DIPHENHYDRAMINE HCL 50 MG/ML IJ SOLN
25.0000 mg | Freq: Once | INTRAMUSCULAR | Status: AC
  Administered 2013-08-08: 25 mg via INTRAVENOUS
  Filled 2013-08-08: qty 1

## 2013-08-08 MED ORDER — DEXAMETHASONE SODIUM PHOSPHATE 10 MG/ML IJ SOLN
10.0000 mg | Freq: Once | INTRAMUSCULAR | Status: AC
  Administered 2013-08-08: 10 mg via INTRAVENOUS
  Filled 2013-08-08: qty 1

## 2013-08-08 MED ORDER — SODIUM CHLORIDE 0.9 % IV BOLUS (SEPSIS)
1000.0000 mL | INTRAVENOUS | Status: AC
  Administered 2013-08-08: 1000 mL via INTRAVENOUS

## 2013-08-08 NOTE — ED Provider Notes (Signed)
CSN: [id number]     Arrival date & time 08/08/13  1653 History   First MD Initiated Contact with Patient 08/08/13 1701     Chief Complaint  Patient presents with  . Dizziness     (Consider location/radiation/quality/duration/timing/severity/associated sxs/prior Treatment) Patient is a 38 y.o. female presenting with dizziness and headaches. The history is provided by the patient.  Dizziness Quality:  Imbalance Severity:  Mild Onset quality:  Gradual Duration:  1 day Timing:  Constant Progression:  Worsening Chronicity:  New Context comment:  While at work Relieved by:  Being still Worsened by:  Nothing tried Ineffective treatments:  None tried Associated symptoms: headaches and nausea   Associated symptoms: no chest pain, no diarrhea, no shortness of breath and no vomiting   Headache Pain location:  Frontal Quality:  Dull Radiates to:  Does not radiate Severity currently:  10/10 Severity at highest:  10/10 Onset quality:  Gradual Duration:  1 day Timing:  Constant Progression:  Worsening Chronicity:  New Similar to prior headaches: yes   Context comment:  While at work Relieved by:  Nothing Worsened by:  Nothing tried Ineffective treatments:  None tried Associated symptoms: dizziness and nausea   Associated symptoms: no abdominal pain, no back pain, no congestion, no cough, no diarrhea, no pain, no fatigue, no fever, no neck pain and no vomiting     Past Medical History  Diagnosis Date  . Hypertension   . Hernia, hiatal    Past Surgical History  Procedure Laterality Date  . Appendectomy    . Dilation and curettage of uterus     Family History  Problem Relation Age of Onset  . Hypertension Mother   . Diabetes Mother   . Diabetes Maternal Aunt   . Hypertension Maternal Aunt   . Diabetes Maternal Aunt   . Hypertension Maternal Aunt    History  Substance Use Topics  . Smoking status: Never Smoker   . Smokeless tobacco: Not on file  . Alcohol Use: Yes    Comment: occ   OB History   Grav Para Term Preterm Abortions TAB SAB Ect Mult Living   2    1  1   1      Review of Systems  Constitutional: Negative for fever and fatigue.  HENT: Negative for congestion and drooling.   Eyes: Negative for pain.  Respiratory: Negative for cough and shortness of breath.   Cardiovascular: Negative for chest pain.  Gastrointestinal: Positive for nausea. Negative for vomiting, abdominal pain and diarrhea.  Genitourinary: Negative for dysuria and hematuria.  Musculoskeletal: Negative for back pain, gait problem and neck pain.  Skin: Negative for color change.  Neurological: Positive for dizziness and headaches.  Hematological: Negative for adenopathy.  Psychiatric/Behavioral: Negative for behavioral problems.  All other systems reviewed and are negative.      Allergies  Ivp dye  Home Medications   Current Outpatient Rx  Name  Route  Sig  Dispense  Refill  . triamterene-hydrochlorothiazide (DYAZIDE) 37.5-25 MG per capsule   Oral   Take 1 capsule by mouth daily.         Marland Kitchen. PRESCRIPTION MEDICATION   Oral   Take 1 tablet by mouth daily. Unknown blood pressure pill          . PRESCRIPTION MEDICATION   Oral   Take 1 tablet by mouth at bedtime. Unknown anxiety pill           BP 144/98  Pulse 66  Temp(Src) 98.1 F (  36.7 C) (Oral)  Ht 5\' 11"  (1.803 m)  Wt 263 lb (119.296 kg)  BMI 36.70 kg/m2  SpO2 100%  LMP 08/02/2013 Physical Exam  Nursing note and vitals reviewed. Constitutional: She is oriented to person, place, and time. She appears well-developed and well-nourished.  HENT:  Head: Normocephalic.  Mouth/Throat: Oropharynx is clear and moist. No oropharyngeal exudate.  Eyes: Conjunctivae and EOM are normal. Pupils are equal, round, and reactive to light.  Neck: Normal range of motion. Neck supple.  Cardiovascular: Normal rate, regular rhythm, normal heart sounds and intact distal pulses.  Exam reveals no gallop and no friction  rub.   No murmur heard. Pulmonary/Chest: Effort normal and breath sounds normal. No respiratory distress. She has no wheezes.  Abdominal: Soft. Bowel sounds are normal. There is no tenderness. There is no rebound and no guarding.  Musculoskeletal: Normal range of motion. She exhibits no edema and no tenderness.  Neurological: She is alert and oriented to person, place, and time. She has normal strength. No cranial nerve deficit or sensory deficit. She displays a negative Romberg sign. Coordination and gait normal.  alert, oriented x3 speech: normal in context and clarity memory: intact grossly cranial nerves II-XII: intact motor strength: full proximally and distally no involuntary movements or tremors sensation: intact to light touch diffusely  cerebellar: finger-to-nose and heel-to-shin intact gait: normal forwards and backwards, normal tandem gait   Skin: Skin is warm and dry.  Psychiatric: She has a normal mood and affect. Her behavior is normal.    ED Course  Procedures (including critical care time) Labs Review Labs Reviewed  PREGNANCY, URINE   Imaging Review No results found.   EKG Interpretation None      MDM   Final diagnoses:  Headache    5:31 PM 38 y.o. female presents with a headache and dizziness which began yesterday. She states that she had gradual onset of dizziness and headache while at work yesterday. She notes the dizziness to be positional. She states that the headache persisted today and she also saw some halos around objects when she went outside. She is no longer seeing the halos. She denies any fevers, vomiting, or diarrhea. She has had some mild nausea. She states that she gets headaches approximately once per week. She has had similar headaches although this seems to be more painful and with accompanying dizziness and visual changes. She is afebrile and vital signs are unremarkable here. She has a normal neurologic exam. I offered a CT of her head.  She would prefer medical treatment and reevaluation, I think this is reasonable.  7:18 PM: Pt notes HA has dec to 7/10. She would like to go home.  Low suspicion for [facility] given gradual onset and hx of HA's. I have discussed the diagnosis/risks/treatment options with the patient and believe the pt to be eligible for discharge home to follow-up with pcp as needed. We also discussed returning to the ED immediately if new or worsening sx occur. We discussed the sx which are most concerning (e.g., worsening HA, fever, vomiting) that necessitate immediate return. Medications administered to the patient during their visit and any new prescriptions provided to the patient are listed below.  Medications given during this visit Medications  sodium chloride 0.9 % bolus 1,000 mL (0 mLs Intravenous Stopped 08/08/13 1854)  metoCLOPramide (REGLAN) injection 10 mg (10 mg Intravenous Given 08/08/13 1742)  diphenhydrAMINE (BENADRYL) injection 25 mg (25 mg Intravenous Given 08/08/13 1741)  dexamethasone (DECADRON) injection 10 mg (10 mg  Intravenous Given 08/08/13 1741)    New Prescriptions   No medications on file     Junius Argyle, MD 08/08/13 2340

## 2013-08-08 NOTE — ED Notes (Signed)
Pt ambulatory to restroom, steady gait.

## 2013-08-08 NOTE — ED Notes (Signed)
Pt reports yesterday with dizziness, worse with positional changes.  Today dizziness worsened and reports color changes in vision, migraine and 'pressure' behind bilateral eyes.

## 2013-08-08 NOTE — ED Notes (Signed)
MD at bedside. 

## 2013-10-10 ENCOUNTER — Encounter (HOSPITAL_BASED_OUTPATIENT_CLINIC_OR_DEPARTMENT_OTHER): Payer: Self-pay | Admitting: Emergency Medicine

## 2013-10-10 ENCOUNTER — Emergency Department (HOSPITAL_BASED_OUTPATIENT_CLINIC_OR_DEPARTMENT_OTHER)
Admission: EM | Admit: 2013-10-10 | Discharge: 2013-10-10 | Disposition: A | Discharge status: Home / Self Care | Payer: BC Managed Care – PPO | Attending: Emergency Medicine | Admitting: Emergency Medicine

## 2013-10-10 DIAGNOSIS — Z79899 Other long term (current) drug therapy: Secondary | ICD-10-CM | POA: Insufficient documentation

## 2013-10-10 DIAGNOSIS — Z8719 Personal history of other diseases of the digestive system: Secondary | ICD-10-CM | POA: Insufficient documentation

## 2013-10-10 DIAGNOSIS — I10 Essential (primary) hypertension: Secondary | ICD-10-CM | POA: Insufficient documentation

## 2013-10-10 DIAGNOSIS — R5381 Other malaise: Secondary | ICD-10-CM | POA: Insufficient documentation

## 2013-10-10 DIAGNOSIS — R51 Headache: Secondary | ICD-10-CM | POA: Insufficient documentation

## 2013-10-10 DIAGNOSIS — J020 Streptococcal pharyngitis: Secondary | ICD-10-CM | POA: Insufficient documentation

## 2013-10-10 DIAGNOSIS — R5383 Other fatigue: Secondary | ICD-10-CM

## 2013-10-10 DIAGNOSIS — R110 Nausea: Secondary | ICD-10-CM | POA: Insufficient documentation

## 2013-10-10 LAB — RAPID STREP SCREEN (MED CTR MEBANE ONLY): Streptococcus, Group A Screen (Direct): POSITIVE — AB

## 2013-10-10 MED ORDER — PENICILLIN G BENZATHINE 1200000 UNIT/2ML IM SUSP
1.2000 10*6.[IU] | Freq: Once | INTRAMUSCULAR | Status: AC
Start: 2013-10-10 — End: 2013-10-10
  Administered 2013-10-10: 1.2 10*6.[IU] via INTRAMUSCULAR
  Filled 2013-10-10: qty 2

## 2013-10-10 NOTE — ED Provider Notes (Signed)
CSN: [id number]     Arrival date & time 10/10/13  1555 History   First MD Initiated Contact with Patient 10/10/13 1620     Chief Complaint  Patient presents with  . Sore Throat     (Consider location/radiation/quality/duration/timing/severity/associated sxs/prior Treatment) HPI Comments: Patient presents with sore throat. She's had sore throat and some runny nose and nasal congestion for about 3 days. She has a bifrontal headache and some mild nausea. She had a little bit of diarrhea yesterday. She denies any cough or chest congestion. She denies any fevers or chills. She says it hurts when she swallows but she denies any difficulty swallowing or controlling secretions.  Patient is a 38 y.o. female presenting with pharyngitis.  Sore Throat Associated symptoms include headaches. Pertinent negatives include no chest pain, no abdominal pain and no shortness of breath.    Past Medical History  Diagnosis Date  . Hypertension   . Hernia, hiatal    Past Surgical History  Procedure Laterality Date  . Appendectomy    . Dilation and curettage of uterus     Family History  Problem Relation Age of Onset  . Hypertension Mother   . Diabetes Mother   . Diabetes Maternal Aunt   . Hypertension Maternal Aunt   . Diabetes Maternal Aunt   . Hypertension Maternal Aunt    History  Substance Use Topics  . Smoking status: Never Smoker   . Smokeless tobacco: Not on file  . Alcohol Use: Yes     Comment: occ   OB History   Grav Para Term Preterm Abortions TAB SAB Ect Mult Living   2    1  1   1      Review of Systems  Constitutional: Positive for fatigue. Negative for fever, chills and diaphoresis.  HENT: Positive for congestion, postnasal drip, rhinorrhea and sore throat. Negative for sneezing.   Eyes: Negative.   Respiratory: Negative for cough, chest tightness and shortness of breath.   Cardiovascular: Negative for chest pain and leg swelling.  Gastrointestinal: Positive for nausea.  Negative for vomiting, abdominal pain, diarrhea and blood in stool.  Genitourinary: Negative for frequency, hematuria, flank pain and difficulty urinating.  Musculoskeletal: Negative for arthralgias and back pain.  Skin: Negative for rash.  Neurological: Positive for headaches. Negative for dizziness, speech difficulty, weakness and numbness.      Allergies  Ivp dye  Home Medications   Prior to Admission medications   Medication Sig Start Date End Date Taking? Authorizing Provider  PRESCRIPTION MEDICATION Take 1 tablet by mouth daily. Unknown blood pressure pill     Historical Provider, MD  PRESCRIPTION MEDICATION Take 1 tablet by mouth at bedtime. Unknown anxiety pill     Historical Provider, MD  triamterene-hydrochlorothiazide (DYAZIDE) 37.5-25 MG per capsule Take 1 capsule by mouth daily.    Historical Provider, MD   BP 145/90  Pulse 55  Temp(Src) 98.3 F (36.8 C) (Oral)  Resp 18  Ht 5\' 11"  (1.803 m)  Wt 263 lb (119.296 kg)  BMI 36.70 kg/m2  SpO2 100%  LMP 09/26/2013 Physical Exam  Constitutional: She is oriented to person, place, and time. She appears well-developed and well-nourished.  HENT:  Head: Normocephalic and atraumatic.  Right Ear: External ear normal.  Left Ear: External ear normal.  Mouth/Throat: Oropharynx is clear and moist.  Uvula midline. No trismus.  Eyes: Pupils are equal, round, and reactive to light.  Neck: Normal range of motion. Neck supple.  Cardiovascular: Normal rate, regular rhythm  and normal heart sounds.   Pulmonary/Chest: Effort normal and breath sounds normal. No respiratory distress. She has no wheezes. She has no rales. She exhibits no tenderness.  Abdominal: Soft. Bowel sounds are normal. There is no tenderness. There is no rebound and no guarding.  Musculoskeletal: Normal range of motion. She exhibits no edema.  Lymphadenopathy:    She has no cervical adenopathy.  Neurological: She is alert and oriented to person, place, and time.   Skin: Skin is warm and dry. No rash noted.  Psychiatric: She has a normal mood and affect.    ED Course  Procedures (including critical care time) Labs Review Labs Reviewed  RAPID STREP SCREEN - Abnormal; Notable for the following:    Streptococcus, Group A Screen (Direct) POSITIVE (*)    All other components within normal limits    Imaging Review No results found.   EKG Interpretation None      MDM   Final diagnoses:  Streptococcal pharyngitis    Patient has strep throat. There's no suggestions a peritonsillar abscess. She was treated with Bicillin. She was advised to return if she has no improvement or any worsening symptoms.    Rolan Bucco, MD 10/10/13 [phone]

## 2013-10-10 NOTE — Discharge Instructions (Signed)
Pharyngitis °Pharyngitis is redness, pain, and swelling (inflammation) of your pharynx.  °CAUSES  °Pharyngitis is usually caused by infection. Most of the time, these infections are from viruses (viral) and are part of a cold. However, sometimes pharyngitis is caused by bacteria (bacterial). Pharyngitis can also be caused by allergies. Viral pharyngitis may be spread from person to person by coughing, sneezing, and personal items or utensils (cups, forks, spoons, toothbrushes). Bacterial pharyngitis may be spread from person to person by more intimate contact, such as kissing.  °SIGNS AND SYMPTOMS  °Symptoms of pharyngitis include:   °· Sore throat.   °· Tiredness (fatigue).   °· Low-grade fever.   °· Headache. °· Joint pain and muscle aches. °· Skin rashes. °· Swollen lymph nodes. °· Plaque-like film on throat or tonsils (often seen with bacterial pharyngitis). °DIAGNOSIS  °Your health care provider will ask you questions about your illness and your symptoms. Your medical history, along with a physical exam, is often all that is needed to diagnose pharyngitis. Sometimes, a rapid strep test is done. Other lab tests may also be done, depending on the suspected cause.  °TREATMENT  °Viral pharyngitis will usually get better in 3 4 days without the use of medicine. Bacterial pharyngitis is treated with medicines that kill germs (antibiotics).  °HOME CARE INSTRUCTIONS  °· Drink enough water and fluids to keep your urine clear or pale yellow.   °· Only take over-the-counter or prescription medicines as directed by your health care provider:   °· If you are prescribed antibiotics, make sure you finish them even if you start to feel better.   °· Do not take aspirin.   °· Get lots of rest.   °· Gargle with 8 oz of salt water (½ tsp of salt per 1 qt of water) as often as every 1 2 hours to soothe your throat.   °· Throat lozenges (if you are not at risk for choking) or sprays may be used to soothe your throat. °SEEK MEDICAL  CARE IF:  °· You have large, tender lumps in your neck. °· You have a rash. °· You cough up green, yellow-brown, or bloody spit. °SEEK IMMEDIATE MEDICAL CARE IF:  °· Your neck becomes stiff. °· You drool or are unable to swallow liquids. °· You vomit or are unable to keep medicines or liquids down. °· You have severe pain that does not go away with the use of recommended medicines. °· You have trouble breathing (not caused by a stuffy nose). °MAKE SURE YOU:  °· Understand these instructions. °· Will watch your condition. °· Will get help right away if you are not doing well or get worse. °Document Released: 05/08/2005 Document Revised: 02/26/2013 Document Reviewed: 01/13/2013 °[employer]® Patient Information ©2014 [employer]. ° °

## 2013-10-10 NOTE — ED Notes (Signed)
Pt. Reports sore throat and headache today with a little diarrhea on yesterday.

## 2014-03-17 ENCOUNTER — Encounter (HOSPITAL_BASED_OUTPATIENT_CLINIC_OR_DEPARTMENT_OTHER): Payer: Self-pay | Admitting: Emergency Medicine

## 2014-03-17 ENCOUNTER — Emergency Department (HOSPITAL_BASED_OUTPATIENT_CLINIC_OR_DEPARTMENT_OTHER): Payer: BC Managed Care – PPO

## 2014-03-17 ENCOUNTER — Emergency Department (HOSPITAL_BASED_OUTPATIENT_CLINIC_OR_DEPARTMENT_OTHER)
Admission: EM | Admit: 2014-03-17 | Discharge: 2014-03-17 | Disposition: A | Discharge status: Home / Self Care | Payer: BC Managed Care – PPO | Attending: Emergency Medicine | Admitting: Emergency Medicine

## 2014-03-17 DIAGNOSIS — Z8719 Personal history of other diseases of the digestive system: Secondary | ICD-10-CM | POA: Insufficient documentation

## 2014-03-17 DIAGNOSIS — I10 Essential (primary) hypertension: Secondary | ICD-10-CM | POA: Insufficient documentation

## 2014-03-17 DIAGNOSIS — R142 Eructation: Secondary | ICD-10-CM | POA: Insufficient documentation

## 2014-03-17 DIAGNOSIS — R110 Nausea: Secondary | ICD-10-CM | POA: Diagnosis not present

## 2014-03-17 DIAGNOSIS — Z79899 Other long term (current) drug therapy: Secondary | ICD-10-CM | POA: Insufficient documentation

## 2014-03-17 DIAGNOSIS — R079 Chest pain, unspecified: Secondary | ICD-10-CM | POA: Diagnosis present

## 2014-03-17 MED ORDER — BACLOFEN 10 MG PO TABS: 10.0000 mg | ORAL_TABLET | Freq: Three times a day (TID) | ORAL | Status: DC

## 2014-03-17 NOTE — Discharge Instructions (Signed)
Discontinuation of such habits as gum chewing, smoking, drinking carbonated beverages, and gulping food and liquids may help with your belching.

## 2014-03-17 NOTE — ED Notes (Signed)
Burping and chest pain x 4 days. [facility] and she has acid reflux and given meds with no relief.

## 2014-03-17 NOTE — ED Provider Notes (Signed)
CSN: [id number]     Arrival date & time 03/17/14  1820 History  This chart was scribed for Purvis SheffieldForrest Meilani Edmundson, MD by Richarda Overlieichard Holland, ED Scribe. This patient was seen in room MH10/MH10 and the patient's care was started 7:05 PM.    Chief Complaint  Patient presents with  . Chest Pain    Patient is a 38 y.o. female presenting with chest pain. The history is provided by the patient. No language interpreter was used.  Chest Pain Pain location:  Unable to specify Pain radiates to:  Does not radiate Pain radiates to the back: no   Pain severity:  Mild Onset quality:  Gradual Duration:  3 days Timing:  Intermittent Progression:  Unchanged Associated symptoms: nausea   Associated symptoms: no abdominal pain, no back pain, no cough, no fatigue, no headache and not vomiting   Risk factors: not pregnant    HPI Comments: Eliezer Mccoymanda K Vanderzanden is a 38 y.o. female who presents to the Emergency Department complaining of constant burping and chest pain for the past 4 days. She says she can not go 15 minutes without burping. Pt reports that [facility] and was given acid reflux medications and they took a CXR. She reports that the medication did not relieve her symptoms. She reports her last BM was 3 days ago. She reports associated nausea as a symptom. She sates she only has CP when she burps. She denies vomiting as a symptom. Reports a history of appendectomy.     Past Medical History  Diagnosis Date  . Hypertension   . Hernia, hiatal    Past Surgical History  Procedure Laterality Date  . Appendectomy    . Dilation and curettage of uterus     Family History  Problem Relation Age of Onset  . Hypertension Mother   . Diabetes Mother   . Diabetes Maternal Aunt   . Hypertension Maternal Aunt   . Diabetes Maternal Aunt   . Hypertension Maternal Aunt    History  Substance Use Topics  . Smoking status: Never Smoker   . Smokeless tobacco: Not on file  . Alcohol Use: Yes      Comment: occ   OB History   Grav Para Term Preterm Abortions TAB SAB Ect Mult Living   2    1  1   1      Review of Systems  Constitutional: Negative for appetite change and fatigue.  HENT: Negative for congestion, ear discharge and sinus pressure.   Eyes: Negative for discharge.  Respiratory: Negative for cough.   Cardiovascular: Positive for chest pain.  Gastrointestinal: Positive for nausea. Negative for vomiting, abdominal pain and diarrhea.  Genitourinary: Negative for frequency and hematuria.  Musculoskeletal: Negative for back pain.  Skin: Negative for rash.  Neurological: Negative for seizures and headaches.  Psychiatric/Behavioral: Negative for hallucinations.  All other systems reviewed and are negative.     Allergies  Ivp dye  Home Medications   Prior to Admission medications   Medication Sig Start Date End Date Taking? Authorizing Provider  Lansoprazole (PREVACID PO) Take by mouth.   Yes Historical Provider, MD  Promethazine HCl (PHENERGAN PO) Take by mouth.   Yes Historical Provider, MD  PRESCRIPTION MEDICATION Take 1 tablet by mouth daily. Unknown blood pressure pill     Historical Provider, MD  PRESCRIPTION MEDICATION Take 1 tablet by mouth at bedtime. Unknown anxiety pill     Historical Provider, MD  triamterene-hydrochlorothiazide (DYAZIDE) 37.5-25  MG per capsule Take 1 capsule by mouth daily.    Historical Provider, MD   BP 141/86  Pulse 64  Temp(Src) 97.7 F (36.5 C) (Oral)  Resp 24  Ht 5\' 11"  (1.803 m)  Wt 251 lb (113.853 kg)  BMI 35.02 kg/m2  SpO2 100%  LMP 03/17/2014 Physical Exam  Nursing note and vitals reviewed. Constitutional: She is oriented to person, place, and time. She appears well-developed and well-nourished.  Belching frequently during exam.   HENT:  Head: Normocephalic.  Eyes: Conjunctivae and EOM are normal. Pupils are equal, round, and reactive to light. No scleral icterus.  Neck: Neck supple. No thyromegaly present.   Cardiovascular: Normal rate and regular rhythm.  Exam reveals no gallop and no friction rub.   No murmur heard. Pulmonary/Chest: Effort normal and breath sounds normal. No stridor. She has no wheezes. She has no rales. She exhibits no tenderness.  Abdominal: She exhibits no distension. There is no tenderness. There is no rebound.  Musculoskeletal: Normal range of motion. She exhibits no edema.  Lymphadenopathy:    She has no cervical adenopathy.  Neurological: She is alert and oriented to person, place, and time. She exhibits normal muscle tone. Coordination normal.  Skin: Skin is warm and dry. No rash noted. No erythema.  Psychiatric: She has a normal mood and affect. Her behavior is normal.    ED Course  Procedures  DIAGNOSTIC STUDIES: Oxygen Saturation is 100% on RA, normal by my interpretation.    COORDINATION OF CARE: 7:16 PM Discussed treatment plan with pt at bedside and pt agreed to plan.   Labs Review Labs Reviewed - No data to display  Imaging Review Dg Abd Acute W/chest  03/17/2014   CLINICAL DATA:  Belching. Central chest pain. Gastroesophageal reflux.  EXAM: ACUTE ABDOMEN SERIES (ABDOMEN 2 VIEW & CHEST 1 VIEW)  COMPARISON:  Chest radiograph on 03/14/2014  FINDINGS: There is no evidence of dilated bowel loops or free intraperitoneal air. No radiopaque calculi or other significant radiographic abnormality is seen. Bilateral pelvic phleboliths incidentally noted.  Heart size and mediastinal contours are within normal limits. Both lungs are clear.  IMPRESSION: No acute findings.  No active cardiopulmonary disease.   Electronically Signed   By: Myles RosenthalJohn  Stahl M.D.   On: 03/17/2014 19:42     EKG Interpretation   Date/Time:  Tuesday March 17 2014 18:35:59 EDT Ventricular Rate:  72 PR Interval:  178 QRS Duration: 68 QT Interval:  400 QTC Calculation: 438 R Axis:   26 Text Interpretation:  Normal sinus rhythm Possible Left atrial enlargement  Septal infarct , age  undetermined now with isolated t wave inversions in  V3 Confirmed by Eragon Hammond  MD, Tarisa Paola ([id number]) on 03/17/2014 6:45:28 PM      MDM   Final diagnoses:  Belching   8:13 PM 38 y.o. female here with persistent belching over the last 2 days. [facility] with noncontributory chest x-ray and workup. She has been on a PPI since then without significant relief. She also [facility] without relief. She states that she only has pain in her chest right before she burps. Plain films unremarkable here. Will try baclofen 3 times a day. The patient has follow-up with her PCP tomorrow. Ddx includes voluntary and involuntary causes such as anxiety. The pt does have f/u w/ her pcp tomorrow.   8:14 PM:  I have discussed the diagnosis/risks/treatment options with the patient and believe the pt to be  eligible for discharge home to follow-up with pcp as scheduled tomorrow. We also discussed returning to the ED immediately if new or worsening sx occur. We discussed the sx which are most concerning (e.g., cp, sob, fever) that necessitate immediate return. Medications administered to the patient during their visit and any new prescriptions provided to the patient are listed below.  Medications given during this visit Medications - No data to display  New Prescriptions   BACLOFEN (LIORESAL) 10 MG TABLET    Take 1 tablet (10 mg total) by mouth 3 (three) times daily.       I personally performed the services described in this documentation, which was scribed in my presence. The recorded information has been reviewed and is accurate.      Purvis Sheffield, MD 03/18/14 1430

## 2014-03-23 ENCOUNTER — Encounter (HOSPITAL_BASED_OUTPATIENT_CLINIC_OR_DEPARTMENT_OTHER): Payer: Self-pay | Admitting: Emergency Medicine

## 2014-12-28 ENCOUNTER — Encounter (HOSPITAL_BASED_OUTPATIENT_CLINIC_OR_DEPARTMENT_OTHER): Payer: Self-pay

## 2014-12-28 ENCOUNTER — Emergency Department (HOSPITAL_BASED_OUTPATIENT_CLINIC_OR_DEPARTMENT_OTHER): Payer: BLUE CROSS/BLUE SHIELD

## 2014-12-28 ENCOUNTER — Emergency Department (HOSPITAL_BASED_OUTPATIENT_CLINIC_OR_DEPARTMENT_OTHER)
Admission: EM | Admit: 2014-12-28 | Discharge: 2014-12-28 | Disposition: A | Discharge status: Home / Self Care | Payer: BLUE CROSS/BLUE SHIELD | Attending: Emergency Medicine | Admitting: Emergency Medicine

## 2014-12-28 DIAGNOSIS — R42 Dizziness and giddiness: Secondary | ICD-10-CM | POA: Insufficient documentation

## 2014-12-28 DIAGNOSIS — R102 Pelvic and perineal pain unspecified side: Secondary | ICD-10-CM

## 2014-12-28 DIAGNOSIS — Z3202 Encounter for pregnancy test, result negative: Secondary | ICD-10-CM | POA: Diagnosis not present

## 2014-12-28 DIAGNOSIS — I10 Essential (primary) hypertension: Secondary | ICD-10-CM | POA: Insufficient documentation

## 2014-12-28 DIAGNOSIS — N939 Abnormal uterine and vaginal bleeding, unspecified: Secondary | ICD-10-CM | POA: Diagnosis present

## 2014-12-28 DIAGNOSIS — Z86718 Personal history of other venous thrombosis and embolism: Secondary | ICD-10-CM | POA: Insufficient documentation

## 2014-12-28 DIAGNOSIS — Z79899 Other long term (current) drug therapy: Secondary | ICD-10-CM | POA: Insufficient documentation

## 2014-12-28 DIAGNOSIS — N390 Urinary tract infection, site not specified: Secondary | ICD-10-CM

## 2014-12-28 DIAGNOSIS — Z8719 Personal history of other diseases of the digestive system: Secondary | ICD-10-CM | POA: Insufficient documentation

## 2014-12-28 DIAGNOSIS — E876 Hypokalemia: Secondary | ICD-10-CM | POA: Diagnosis not present

## 2014-12-28 HISTORY — DX: Acute embolism and thrombosis of unspecified deep veins of unspecified lower extremity: I82.409

## 2014-12-28 LAB — URINALYSIS, ROUTINE W REFLEX MICROSCOPIC
Glucose, UA: NEGATIVE mg/dL
Ketones, ur: 15 mg/dL — AB
NITRITE: NEGATIVE
PROTEIN: 100 mg/dL — AB
Specific Gravity, Urine: 1.034 — ABNORMAL HIGH (ref 1.005–1.030)
Urobilinogen, UA: 1 mg/dL (ref 0.0–1.0)
pH: 6 (ref 5.0–8.0)

## 2014-12-28 LAB — CBC WITH DIFFERENTIAL/PLATELET
Basophils Absolute: 0 10*3/uL (ref 0.0–0.1)
Basophils Relative: 0 % (ref 0–1)
Eosinophils Absolute: 0.2 10*3/uL (ref 0.0–0.7)
Eosinophils Relative: 3 % (ref 0–5)
HEMATOCRIT: 38.7 % (ref 36.0–46.0)
HEMOGLOBIN: 12.2 g/dL (ref 12.0–15.0)
LYMPHS PCT: 47 % — AB (ref 12–46)
Lymphs Abs: 2.7 10*3/uL (ref 0.7–4.0)
MCH: 27.5 pg (ref 26.0–34.0)
MCHC: 31.5 g/dL (ref 30.0–36.0)
MCV: 87.2 fL (ref 78.0–100.0)
MONO ABS: 0.5 10*3/uL (ref 0.1–1.0)
Monocytes Relative: 9 % (ref 3–12)
NEUTROS ABS: 2.4 10*3/uL (ref 1.7–7.7)
Neutrophils Relative %: 41 % — ABNORMAL LOW (ref 43–77)
Platelets: 262 10*3/uL (ref 150–400)
RBC: 4.44 MIL/uL (ref 3.87–5.11)
RDW: 13.9 % (ref 11.5–15.5)
WBC: 5.7 10*3/uL (ref 4.0–10.5)

## 2014-12-28 LAB — BASIC METABOLIC PANEL
Anion gap: 8 (ref 5–15)
BUN: 12 mg/dL (ref 6–20)
CALCIUM: 8.7 mg/dL — AB (ref 8.9–10.3)
CO2: 27 mmol/L (ref 22–32)
CREATININE: 0.82 mg/dL (ref 0.44–1.00)
Chloride: 105 mmol/L (ref 101–111)
GFR calc non Af Amer: >60 mL/min (ref >60)
Glucose, Bld: 97 mg/dL (ref 65–99)
Potassium: 3.3 mmol/L — ABNORMAL LOW (ref 3.5–5.1)
Sodium: 140 mmol/L (ref 135–145)

## 2014-12-28 LAB — WET PREP, GENITAL
CLUE CELLS WET PREP: NONE SEEN
Trich, Wet Prep: NONE SEEN
WBC, Wet Prep HPF POC: NONE SEEN
YEAST WET PREP: NONE SEEN

## 2014-12-28 LAB — URINE MICROSCOPIC-ADD ON

## 2014-12-28 LAB — PREGNANCY, URINE: PREG TEST UR: NEGATIVE

## 2014-12-28 MED ORDER — CEPHALEXIN 250 MG PO CAPS
500.0000 mg | ORAL_CAPSULE | Freq: Once | ORAL | Status: AC
  Administered 2014-12-28: 500 mg via ORAL
  Filled 2014-12-28: qty 2

## 2014-12-28 MED ORDER — POTASSIUM CHLORIDE CRYS ER 20 MEQ PO TBCR
20.0000 meq | EXTENDED_RELEASE_TABLET | Freq: Once | ORAL | Status: AC
  Administered 2014-12-28: 20 meq via ORAL
  Filled 2014-12-28: qty 1

## 2014-12-28 MED ORDER — ONDANSETRON HCL 4 MG/2ML IJ SOLN
4.0000 mg | Freq: Once | INTRAMUSCULAR | Status: AC
Start: 2014-12-28 — End: 2014-12-28
  Administered 2014-12-28: 4 mg via INTRAVENOUS
  Filled 2014-12-28: qty 2

## 2014-12-28 MED ORDER — HYDROMORPHONE HCL 1 MG/ML IJ SOLN
1.0000 mg | Freq: Once | INTRAMUSCULAR | Status: AC
  Administered 2014-12-28: 1 mg via INTRAVENOUS
  Filled 2014-12-28: qty 1

## 2014-12-28 MED ORDER — CEPHALEXIN 500 MG PO CAPS: 500.0000 mg | ORAL_CAPSULE | Freq: Four times a day (QID) | ORAL | Status: DC

## 2014-12-28 MED ORDER — SODIUM CHLORIDE 0.9 % IV BOLUS (SEPSIS)
1000.0000 mL | Freq: Once | INTRAVENOUS | Status: AC
  Administered 2014-12-28: 1000 mL via INTRAVENOUS

## 2014-12-28 NOTE — ED Provider Notes (Signed)
CSN: [id number]     Arrival date & time 12/28/14  1858 History   First MD Initiated Contact with Patient 12/28/14 1942     Chief Complaint  Patient presents with  . Vaginal Bleeding     (Consider location/radiation/quality/duration/timing/severity/associated sxs/prior Treatment) Patient is a 39 y.o. female presenting with vaginal bleeding. The history is provided by the patient. No language interpreter was used.  Vaginal Bleeding Associated symptoms: abdominal pain, dizziness and nausea    Brenda Michael is a 39 year old female G3 P1 A2 with a history of hypertension and DVT who presents for vaginal bleeding for the past 3 weeks and pelvic pain for the past few days. She states she had a miscarriage in May and this is the first time she has had her period since. She states she goes through one tampon an hour and has to wear a pad for any excess blood. She is also complaining of dizziness and nausea. She states she has tried to drink ginger ale and crackers but is still vomiting several times a day without blood soon after eating.  She denies any fever, chills, chest pain, shortness of breath, cough, urinary frequency, dysuria. She is not on birth control. No history of STDs. One sexual partner. S/P appendectomy. Past Medical History  Diagnosis Date  . Hypertension   . Hernia, hiatal   . DVT (deep venous thrombosis)    Past Surgical History  Procedure Laterality Date  . Appendectomy    . Dilation and curettage of uterus     Family History  Problem Relation Age of Onset  . Hypertension Mother   . Diabetes Mother   . Diabetes Maternal Aunt   . Hypertension Maternal Aunt   . Diabetes Maternal Aunt   . Hypertension Maternal Aunt    History  Substance Use Topics  . Smoking status: Never Smoker   . Smokeless tobacco: Not on file  . Alcohol Use: Yes     Comment: occ   OB History    Gravida Para Term Preterm AB TAB SAB Ectopic Multiple Living   Review of Systems    Gastrointestinal: Positive for nausea, vomiting and abdominal pain.  Genitourinary: Positive for vaginal bleeding.  Neurological: Positive for dizziness.  All other systems reviewed and are negative.     Allergies  Ivp dye  Home Medications   Prior to Admission medications   Medication Sig Start Date End Date Taking? Authorizing Provider  BUPROPION HCL PO Take by mouth.   Yes Historical Provider, MD  cephALEXin (KEFLEX) 500 MG capsule Take 1 capsule (500 mg total) by mouth 4 (four) times daily. 12/28/14   Oviya Ammar Patel-Mills, PA-C  Promethazine HCl (PHENERGAN PO) Take by mouth.    Historical Provider, MD  triamterene-hydrochlorothiazide (DYAZIDE) 37.5-25 MG per capsule Take 1 capsule by mouth daily.    Historical Provider, MD   BP 140/76 mmHg  Pulse 62  Temp(Src) 97.9 F (36.6 C) (Oral)  Resp 18  Ht  (1.803 m)  Wt 261 lb (118.389 kg)  BMI 36.42 kg/m2  SpO2 100%  LMP 12/04/2014 (Approximate) Physical Exam  Constitutional: She is oriented to person, place, and time. She appears well-developed and well-nourished.  HENT:  Head: Normocephalic and atraumatic.  Eyes: Conjunctivae are normal.  Neck: Normal range of motion. Neck supple.  Cardiovascular: Normal rate, regular rhythm and normal heart sounds.   Pulmonary/Chest: Effort normal and breath sounds normal. No respiratory  distress.  Abdominal: Soft. Normal appearance. She exhibits no distension. There is tenderness in the suprapubic area. There is no rebound, no guarding and no CVA tenderness.    Genitourinary: There is bleeding in the vagina. No tenderness in the vagina.  Pelvic exam, chaperone present: Moderate amount of vaginal bleeding. Cervical os closed. No adnexal tenderness.  Musculoskeletal: Normal range of motion.  Neurological: She is alert and oriented to person, place, and time.  Skin: Skin is warm and dry.  Nursing note and vitals reviewed.   ED Course  Procedures (including critical care time) Labs  Review Labs Reviewed  URINALYSIS, ROUTINE W REFLEX MICROSCOPIC (NOT AT [facility]) - Abnormal; Notable for the following:    Color, Urine RED (*)    APPearance TURBID (*)    Specific Gravity, Urine 1.034 (*)    Hgb urine dipstick LARGE (*)    Bilirubin Urine SMALL (*)    Ketones, ur 15 (*)    Protein, ur 100 (*)    Leukocytes, UA SMALL (*)    All other components within normal limits  URINE MICROSCOPIC-ADD ON - Abnormal; Notable for the following:    Squamous Epithelial / LPF FEW (*)    Bacteria, UA FEW (*)    All other components within normal limits  CBC WITH DIFFERENTIAL/PLATELET - Abnormal; Notable for the following:    Neutrophils Relative % 41 (*)    Lymphocytes Relative 47 (*)    All other components within normal limits  BASIC METABOLIC PANEL - Abnormal; Notable for the following:    Potassium 3.3 (*)    Calcium 8.7 (*)    All other components within normal limits  WET PREP, GENITAL  URINE CULTURE  PREGNANCY, URINE    Imaging Review Ct Abdomen Pelvis Wo Contrast  12/28/2014   CLINICAL DATA:  Abdominal pain for 2 base. Vaginal bleeding for 3 weeks.  EXAM: CT ABDOMEN AND PELVIS WITHOUT CONTRAST  TECHNIQUE: Multidetector CT imaging of the abdomen and pelvis was performed following the standard protocol without oral or intravenous contrast material administration.  COMPARISON:  March 15, 2014  FINDINGS: Lung bases are clear.  No focal liver lesions are identified on this noncontrast enhanced study. Gallbladder wall is not appreciably thickened. There is no biliary duct dilatation.  Spleen, pancreas, and adrenals appear normal. Kidneys bilaterally show no mass or hydronephrosis on either side. There is no renal or ureteral calculus on either side. There are multiple pelvic phleboliths which are near but felt to be separate from the ureters.  In the pelvis, the urinary bladder is midline with normal wall thickness. There is mild fat in the inguinal rings bilaterally. There is no pelvic  mass or pelvic fluid collection. There are scattered small sigmoid diverticula without diverticulitis. Appendix is absent by report. There appears to be a small appendiceal remnant which appears normal. This finding is seen on axial slices 58 and 59 series 2 and coronal slices 48 through 51 series 5.  There is no bowel obstruction. No free air or portal venous air. There is no demonstrable ascites, adenopathy, or abscess in the abdomen or pelvis. There is no abdominal aortic aneurysm. There are no blastic or lytic bone lesions.  IMPRESSION: Cause for patient's symptoms has not been established with this study. There are a few sigmoid diverticula without diverticulitis. No bowel obstruction. No abscess. Question tiny appendiceal remnant, noninflamed.  No renal or ureteral calculus.  No hydronephrosis.   Electronically Signed   By: Bretta Bang III M.D.  On: 12/28/2014 22:42     EKG Interpretation None      MDM   Final diagnoses:  Vaginal bleeding  Pelvic pain in female  Urinary tract infection, acute  Hypokalemia   Patient presents for pelvic pain and vaginal bleeding. Vitals stable. Hemoglobin is normal. CT scan is negative for hydronephrosis or any acute abdominal concern. She has a UTI. She is also mildly hypokalemic. I gave her Keflex. Urine culture pending. She has follow-up with her primary care physician on Friday. I discussed return precautions and patient verbally agrees with the plan. Medications  sodium chloride 0.9 % bolus 1,000 mL (0 mLs Intravenous Stopped 12/28/14 2155)  ondansetron (ZOFRAN) injection 4 mg (4 mg Intravenous Given 12/28/14 2046)  HYDROmorphone (DILAUDID) injection 1 mg (1 mg Intravenous Given 12/28/14 2208)  sodium chloride 0.9 % bolus 1,000 mL (1,000 mLs Intravenous New Bag/Given 12/28/14 2208)  cephALEXin (KEFLEX) capsule 500 mg (500 mg Oral Given 12/28/14 2315)  potassium chloride SA (K-DUR,KLOR-CON) CR tablet 20 mEq (20 mEq Oral Given 12/28/14 2315)         Catha Gosselin, PA-C 12/28/14 2320  Richardean Canal, MD 12/28/14 2326

## 2014-12-28 NOTE — ED Notes (Addendum)
C/o vaginal bleeding x 3 weeks-dizziness and abd pain x 2 days-steady gait to triage-miscarriage at 6mos past May

## 2014-12-28 NOTE — Discharge Instructions (Signed)
Urinary Tract Infection Keep your follow up appointment on Friday. Take antibiotics as prescribed. Urinary tract infections (UTIs) can develop anywhere along your urinary tract. Your urinary tract is your body's drainage system for removing wastes and extra water. Your urinary tract includes two kidneys, two ureters, a bladder, and a urethra. Your kidneys are a pair of bean-shaped organs. Each kidney is about the size of your fist. They are located below your ribs, one on each side of your spine. CAUSES Infections are caused by microbes, which are microscopic organisms, including fungi, viruses, and bacteria. These organisms are so small that they can only be seen through a microscope. Bacteria are the microbes that most commonly cause UTIs. SYMPTOMS  Symptoms of UTIs may vary by age and gender of the patient and by the location of the infection. Symptoms in young women typically include a frequent and intense urge to urinate and a painful, burning feeling in the bladder or urethra during urination. Older women and men are more likely to be tired, shaky, and weak and have muscle aches and abdominal pain. A fever may mean the infection is in your kidneys. Other symptoms of a kidney infection include pain in your back or sides below the ribs, nausea, and vomiting. DIAGNOSIS To diagnose a UTI, your caregiver will ask you about your symptoms. Your caregiver also will ask to provide a urine sample. The urine sample will be tested for bacteria and white blood cells. White blood cells are made by your body to help fight infection. TREATMENT  Typically, UTIs can be treated with medication. Because most UTIs are caused by a bacterial infection, they usually can be treated with the use of antibiotics. The choice of antibiotic and length of treatment depend on your symptoms and the type of bacteria causing your infection. HOME CARE INSTRUCTIONS  If you were prescribed antibiotics, take them exactly as your  caregiver instructs you. Finish the medication even if you feel better after you have only taken some of the medication.  Drink enough water and fluids to keep your urine clear or pale yellow.  Avoid caffeine, tea, and carbonated beverages. They tend to irritate your bladder.  Empty your bladder often. Avoid holding urine for long periods of time.  Empty your bladder before and after sexual intercourse.  After a bowel movement, women should cleanse from front to back. Use each tissue only once. SEEK MEDICAL CARE IF:   You have back pain.  You develop a fever.  Your symptoms do not begin to resolve within 3 days. SEEK IMMEDIATE MEDICAL CARE IF:   You have severe back pain or lower abdominal pain.  You develop chills.  You have nausea or vomiting.  You have continued burning or discomfort with urination. MAKE SURE YOU:   Understand these instructions.  Will watch your condition.  Will get help right away if you are not doing well or get worse. Document Released: 02/15/2005 Document Revised: 11/07/2011 Document Reviewed: 06/16/2011 [facility] Patient Information 2015 [place], [place]. This information is not intended to replace advice given to you by your health care provider. Make sure you discuss any questions you have with your health care provider.

## 2014-12-28 NOTE — ED Notes (Signed)
Patient transported to CT 

## 2014-12-29 LAB — GC/CHLAMYDIA PROBE AMP (~~LOC~~) NOT AT ARMC
Chlamydia: NEGATIVE
Neisseria Gonorrhea: NEGATIVE

## 2014-12-29 NOTE — ED Notes (Signed)
Received call from [facility].  Need to order the GC/ [facility] for the patient.  Has specimen in the lab.

## 2014-12-30 LAB — URINE CULTURE: SPECIAL REQUESTS: NORMAL

## 2015-01-02 ENCOUNTER — Telehealth: Payer: Self-pay | Admitting: *Deleted

## 2015-01-02 NOTE — ED Notes (Signed)
(+)  urine culture, treated with Cephalexin, OK per T. Stone, Pharm 

## 2015-12-13 ENCOUNTER — Encounter (HOSPITAL_BASED_OUTPATIENT_CLINIC_OR_DEPARTMENT_OTHER): Payer: Self-pay

## 2015-12-13 ENCOUNTER — Emergency Department (HOSPITAL_BASED_OUTPATIENT_CLINIC_OR_DEPARTMENT_OTHER)
Admission: EM | Admit: 2015-12-13 | Discharge: 2015-12-13 | Disposition: A | Discharge status: Home / Self Care | Payer: BLUE CROSS/BLUE SHIELD | Attending: Emergency Medicine | Admitting: Emergency Medicine

## 2015-12-13 DIAGNOSIS — R110 Nausea: Secondary | ICD-10-CM | POA: Diagnosis not present

## 2015-12-13 DIAGNOSIS — R42 Dizziness and giddiness: Secondary | ICD-10-CM | POA: Insufficient documentation

## 2015-12-13 DIAGNOSIS — I10 Essential (primary) hypertension: Secondary | ICD-10-CM | POA: Diagnosis not present

## 2015-12-13 MED ORDER — ONDANSETRON 4 MG PO TBDP
4.0000 mg | ORAL_TABLET | Freq: Once | ORAL | Status: AC
  Administered 2015-12-13: 4 mg via ORAL
  Filled 2015-12-13: qty 1

## 2015-12-13 MED ORDER — ONDANSETRON 4 MG PO TBDP: 4.0000 mg | ORAL_TABLET | Freq: Three times a day (TID) | ORAL | 0 refills | Status: DC | PRN

## 2015-12-13 MED ORDER — MECLIZINE HCL 25 MG PO TABS
25.0000 mg | ORAL_TABLET | Freq: Once | ORAL | Status: AC
  Administered 2015-12-13: 25 mg via ORAL
  Filled 2015-12-13: qty 1

## 2015-12-13 MED ORDER — MECLIZINE HCL 25 MG PO TABS: 25.0000 mg | ORAL_TABLET | Freq: Three times a day (TID) | ORAL | 0 refills | Status: DC | PRN

## 2015-12-13 NOTE — ED Provider Notes (Signed)
[facility] Provider Note   CSN: [id number] Arrival date & time: 12/13/15  1138  First Provider Contact:  First MD Initiated Contact with Patient 12/13/15 1221        History   Chief Complaint Chief Complaint  Patient presents with  . Dizziness    HPI Brenda Michael is a 40 y.o. female. She states that the last 2 days when she moves her head she will feel like she is tilting or swaying. When this happens she becomes nauseated. After feeling the nausea she gets a bad taste in her mouth but has had no frank emesis. She has no vision changes. No headache. No her pressure but she does have ringing in both ears. No neck pain. No chest pain shortness of breath no additional symptoms. No recent injury. No URI or viral symptoms. No blows to the head.  HPI  Past Medical History:  Diagnosis Date  . DVT (deep venous thrombosis) (HCC)   . Hernia, hiatal   . Hypertension     Patient Active Problem List   Diagnosis Date Noted  . Galactorrhea 04/24/2011  . Hypertension 04/24/2011    Past Surgical History:  Procedure Laterality Date  . APPENDECTOMY    . DILATION AND CURETTAGE OF UTERUS      OB History    Gravida Para Term Preterm AB Living   2       1 1    SAB TAB Ectopic Multiple Live Births   1               Home Medications    Prior to Admission medications   Medication Sig Start Date End Date Taking? Authorizing Provider  BUPROPION HCL PO Take by mouth.    Historical Provider, MD  meclizine (ANTIVERT) 25 MG tablet Take 1 tablet (25 mg total) by mouth 3 (three) times daily as needed for dizziness. 12/13/15   Rolland Porter, MD  ondansetron (ZOFRAN ODT) 4 MG disintegrating tablet Take 1 tablet (4 mg total) by mouth every 8 (eight) hours as needed for nausea. 12/13/15   Rolland Porter, MD  triamterene-hydrochlorothiazide (DYAZIDE) 37.5-25 MG per capsule Take 1 capsule by mouth daily.    Historical Provider, MD    Family History Family History  Problem Relation Age of  Onset  . Hypertension Mother   . Diabetes Mother   . Diabetes Maternal Aunt   . Hypertension Maternal Aunt   . Diabetes Maternal Aunt   . Hypertension Maternal Aunt     Social History Social History  Substance Use Topics  . Smoking status: Never Smoker  . Smokeless tobacco: Never Used  . Alcohol use Yes     Comment: occ     Allergies   Ivp dye [iodinated diagnostic agents]   Review of Systems Review of Systems  Constitutional: Negative for appetite change, chills, diaphoresis, fatigue and fever.  HENT: Negative for mouth sores, sore throat and trouble swallowing.   Eyes: Negative for visual disturbance.  Respiratory: Negative for cough, chest tightness, shortness of breath and wheezing.   Cardiovascular: Negative for chest pain.  Gastrointestinal: Positive for nausea. Negative for abdominal distention, abdominal pain, diarrhea and vomiting.  Endocrine: Negative for polydipsia, polyphagia and polyuria.  Genitourinary: Negative for dysuria, frequency and hematuria.  Musculoskeletal: Negative for gait problem.  Skin: Negative for color change, pallor and rash.  Neurological: Positive for dizziness. Negative for syncope, light-headedness and headaches.  Hematological: Does not bruise/bleed easily.  Psychiatric/Behavioral: Negative for behavioral problems and confusion.  Physical Exam Updated Vital Signs BP 143/96 (BP Location: Left Arm)   Pulse 94   Temp 98.5 F (36.9 C) (Oral)   Resp 18   Ht  (1.803 m)   Wt 263 lb (119.3 kg)   LMP  (LMP Unknown)   SpO2 100%   BMI 36.68 kg/m   Physical Exam  Constitutional: She is oriented to person, place, and time. She appears well-developed and well-nourished. No distress.  HENT:  Head: Normocephalic.  Wearing contacts. Reactive pupils. HEENT exam normal. Has nystagmus with side to side lateral gaze.  TMs normal.  Eyes: Conjunctivae are normal. Pupils are equal, round, and reactive to light. No scleral icterus.    Neck: Normal range of motion. Neck supple. No thyromegaly present.  Cardiovascular: Normal rate and regular rhythm.  Exam reveals no gallop and no friction rub.   No murmur heard. Pulmonary/Chest: Effort normal and breath sounds normal. No respiratory distress. She has no wheezes. She has no rales.  Abdominal: Soft. Bowel sounds are normal. She exhibits no distension. There is no tenderness. There is no rebound.  Musculoskeletal: Normal range of motion.  Neurological: She is alert and oriented to person, place, and time.  Inducible vertigo. Otherwise intact cranial nerves. Normal strength and motor exam of the 4 extremities. Normal cerebellar testing.  Skin: Skin is warm and dry. No rash noted.  Psychiatric: She has a normal mood and affect. Her behavior is normal.     ED Treatments / Results  Labs (all labs ordered are listed, but only abnormal results are displayed) Labs Reviewed - No data to display  EKG  EKG Interpretation None       Radiology No results found.  Procedures Procedures (including critical care time)  Medications Ordered in ED Medications  meclizine (ANTIVERT) tablet 25 mg (not administered)  ondansetron (ZOFRAN-ODT) disintegrating tablet 4 mg (not administered)     Initial Impression / Assessment and Plan / ED Course  I have reviewed the triage vital signs and the nursing notes.  Pertinent labs & imaging results that were available during my care of the patient were reviewed by me and considered in my medical decision making (see chart for details).  Clinical Course    Treatment of vertigo discussed  Final Clinical Impressions(s) / ED Diagnoses   Final diagnoses:  Vertigo    New Prescriptions New Prescriptions   MECLIZINE (ANTIVERT) 25 MG TABLET    Take 1 tablet (25 mg total) by mouth 3 (three) times daily as needed for dizziness.   ONDANSETRON (ZOFRAN ODT) 4 MG DISINTEGRATING TABLET    Take 1 tablet (4 mg total) by mouth every 8 (eight)  hours as needed for nausea.     Rolland Porter, MD 12/13/15 1226

## 2015-12-13 NOTE — ED Triage Notes (Signed)
C/o dizziness, nausea, "metal taste in my mouth" x 3 days-HA x today-denies injury-NAD-steady gait

## 2015-12-13 NOTE — Discharge Instructions (Signed)
Take meclizine until 24 hours without symptoms. No driving until 24 hours without symptoms. Follow-up at [facility] in one week if not resolved

## 2017-10-11 ENCOUNTER — Inpatient Hospital Stay (HOSPITAL_BASED_OUTPATIENT_CLINIC_OR_DEPARTMENT_OTHER)
Admission: EM | Admit: 2017-10-11 | Discharge: 2017-10-15 | DRG: 443 | Disposition: A | Discharge status: Home / Self Care | Payer: BLUE CROSS/BLUE SHIELD | Attending: Family Medicine | Admitting: Family Medicine

## 2017-10-11 ENCOUNTER — Encounter (HOSPITAL_BASED_OUTPATIENT_CLINIC_OR_DEPARTMENT_OTHER): Payer: Self-pay

## 2017-10-11 ENCOUNTER — Emergency Department (HOSPITAL_BASED_OUTPATIENT_CLINIC_OR_DEPARTMENT_OTHER): Payer: BLUE CROSS/BLUE SHIELD

## 2017-10-11 ENCOUNTER — Other Ambulatory Visit: Payer: Self-pay

## 2017-10-11 DIAGNOSIS — R000 Tachycardia, unspecified: Secondary | ICD-10-CM | POA: Diagnosis present

## 2017-10-11 DIAGNOSIS — Z8759 Personal history of other complications of pregnancy, childbirth and the puerperium: Secondary | ICD-10-CM

## 2017-10-11 DIAGNOSIS — R8271 Bacteriuria: Secondary | ICD-10-CM | POA: Diagnosis present

## 2017-10-11 DIAGNOSIS — I81 Portal vein thrombosis: Principal | ICD-10-CM | POA: Diagnosis present

## 2017-10-11 DIAGNOSIS — R1011 Right upper quadrant pain: Secondary | ICD-10-CM

## 2017-10-11 DIAGNOSIS — R1013 Epigastric pain: Secondary | ICD-10-CM | POA: Diagnosis not present

## 2017-10-11 DIAGNOSIS — Z9049 Acquired absence of other specified parts of digestive tract: Secondary | ICD-10-CM

## 2017-10-11 DIAGNOSIS — R072 Precordial pain: Secondary | ICD-10-CM

## 2017-10-11 DIAGNOSIS — K5900 Constipation, unspecified: Secondary | ICD-10-CM | POA: Diagnosis not present

## 2017-10-11 DIAGNOSIS — Z8249 Family history of ischemic heart disease and other diseases of the circulatory system: Secondary | ICD-10-CM

## 2017-10-11 DIAGNOSIS — Z91041 Radiographic dye allergy status: Secondary | ICD-10-CM

## 2017-10-11 DIAGNOSIS — R112 Nausea with vomiting, unspecified: Secondary | ICD-10-CM

## 2017-10-11 DIAGNOSIS — I10 Essential (primary) hypertension: Secondary | ICD-10-CM | POA: Diagnosis present

## 2017-10-11 DIAGNOSIS — Z833 Family history of diabetes mellitus: Secondary | ICD-10-CM

## 2017-10-11 DIAGNOSIS — E876 Hypokalemia: Secondary | ICD-10-CM | POA: Diagnosis present

## 2017-10-11 DIAGNOSIS — R7989 Other specified abnormal findings of blood chemistry: Secondary | ICD-10-CM

## 2017-10-11 HISTORY — DX: Portal vein thrombosis: I81

## 2017-10-11 LAB — URINALYSIS, ROUTINE W REFLEX MICROSCOPIC
Bilirubin Urine: NEGATIVE
Glucose, UA: NEGATIVE mg/dL
Nitrite: NEGATIVE
Protein, ur: 30 mg/dL — AB
Specific Gravity, Urine: 1.02 (ref 1.005–1.030)
pH: 7 (ref 5.0–8.0)

## 2017-10-11 LAB — COMPREHENSIVE METABOLIC PANEL
ALK PHOS: 69 U/L (ref 38–126)
ALT: 21 U/L (ref 14–54)
AST: 19 U/L (ref 15–41)
Albumin: 3.6 g/dL (ref 3.5–5.0)
Anion gap: 9 (ref 5–15)
BUN: 9 mg/dL (ref 6–20)
CALCIUM: 8.6 mg/dL — AB (ref 8.9–10.3)
CO2: 25 mmol/L (ref 22–32)
CREATININE: 0.89 mg/dL (ref 0.44–1.00)
Chloride: 103 mmol/L (ref 101–111)
GFR calc non Af Amer: >60 mL/min (ref >60)
Glucose, Bld: 92 mg/dL (ref 65–99)
Potassium: 3.3 mmol/L — ABNORMAL LOW (ref 3.5–5.1)
Sodium: 137 mmol/L (ref 135–145)
Total Bilirubin: 0.8 mg/dL (ref 0.3–1.2)
Total Protein: 7.5 g/dL (ref 6.5–8.1)

## 2017-10-11 LAB — CBC
HCT: 41.1 % (ref 36.0–46.0)
Hemoglobin: 13.9 g/dL (ref 12.0–15.0)
MCH: 29.7 pg (ref 26.0–34.0)
MCHC: 33.8 g/dL (ref 30.0–36.0)
MCV: 87.8 fL (ref 78.0–100.0)
Platelets: 215 10*3/uL (ref 150–400)
RBC: 4.68 MIL/uL (ref 3.87–5.11)
RDW: 12.7 % (ref 11.5–15.5)
WBC: 9.7 10*3/uL (ref 4.0–10.5)

## 2017-10-11 LAB — URINALYSIS, MICROSCOPIC (REFLEX): RBC / HPF: >50 RBC/hpf (ref 0–5)

## 2017-10-11 LAB — TROPONIN I: Troponin I: <0.03 ng/mL (ref <0.03)

## 2017-10-11 LAB — LIPASE, BLOOD: Lipase: 30 U/L (ref 11–51)

## 2017-10-11 LAB — PREGNANCY, URINE: PREG TEST UR: NEGATIVE

## 2017-10-11 LAB — D-DIMER, QUANTITATIVE: D-Dimer, Quant: 1.71 ug/mL-FEU — ABNORMAL HIGH (ref 0.00–0.50)

## 2017-10-11 MED ORDER — ONDANSETRON HCL 4 MG/2ML IJ SOLN
4.0000 mg | Freq: Once | INTRAMUSCULAR | Status: AC
Start: 2017-10-11 — End: 2017-10-11
  Administered 2017-10-11: 4 mg via INTRAVENOUS
  Filled 2017-10-11: qty 2

## 2017-10-11 MED ORDER — ONDANSETRON HCL 4 MG/2ML IJ SOLN
4.0000 mg | Freq: Once | INTRAMUSCULAR | Status: AC
  Administered 2017-10-11: 4 mg via INTRAVENOUS
  Filled 2017-10-11: qty 2

## 2017-10-11 MED ORDER — HYDROMORPHONE HCL 1 MG/ML IJ SOLN
1.0000 mg | Freq: Once | INTRAMUSCULAR | Status: AC
  Administered 2017-10-12: 1 mg via INTRAVENOUS
  Filled 2017-10-11: qty 1

## 2017-10-11 MED ORDER — GI COCKTAIL ~~LOC~~
30.0000 mL | Freq: Once | ORAL | Status: AC
  Administered 2017-10-11: 30 mL via ORAL
  Filled 2017-10-11: qty 30

## 2017-10-11 MED ORDER — HYDROMORPHONE HCL 1 MG/ML IJ SOLN
1.0000 mg | Freq: Once | INTRAMUSCULAR | Status: AC
  Administered 2017-10-11: 1 mg via INTRAVENOUS
  Filled 2017-10-11: qty 1

## 2017-10-11 MED ORDER — SODIUM CHLORIDE 0.9 % IV BOLUS
1000.0000 mL | Freq: Once | INTRAVENOUS | Status: AC
  Administered 2017-10-11: 1000 mL via INTRAVENOUS

## 2017-10-11 MED ORDER — MORPHINE SULFATE (PF) 4 MG/ML IV SOLN
4.0000 mg | Freq: Once | INTRAVENOUS | Status: AC
  Administered 2017-10-11: 4 mg via INTRAVENOUS
  Filled 2017-10-11: qty 1

## 2017-10-11 NOTE — ED Notes (Signed)
Provider notified of patients pain after GI cocktail.

## 2017-10-11 NOTE — ED Notes (Signed)
Patient transported to Ultrasound 

## 2017-10-11 NOTE — ED Notes (Signed)
ED Provider at bedside. 

## 2017-10-11 NOTE — ED Triage Notes (Signed)
Pt c/o upper epigastric pain and belching x3days, tried every OTC with no relief; went to UC earlier today and was given a GI cocktail with some relief; states EKG, chest and abdominal x-rays were normal and blood work would be in [employer]. States UC just called said her D-dimer was critical high and to come to ER

## 2017-10-11 NOTE — ED Provider Notes (Signed)
[facility] EMERGENCY DEPARTMENT Provider Note   CSN: [id number] Arrival date & time: 10/11/17  2020     History   Chief Complaint Chief Complaint  Patient presents with  . Abdominal Pain    HPI Brenda Michael is a 42 y.o. female.  The history is provided by the patient and medical records.  Abdominal Pain   This is a new problem. The current episode started more than 2 days ago. The problem occurs constantly. The problem has been gradually worsening. The pain is associated with eating. The pain is located in the RUQ and epigastric region. The quality of the pain is aching, dull, sharp, pressure-like and cramping. The pain is at a severity of 10/10. The pain is severe. Associated symptoms include belching, nausea and vomiting. Pertinent negatives include anorexia, fever, diarrhea, constipation, dysuria and frequency. The symptoms are aggravated by palpation and eating. Nothing relieves the symptoms.  Chest Pain   Associated symptoms include abdominal pain, nausea, shortness of breath and vomiting. Pertinent negatives include no back pain, no cough, no diaphoresis, no fever, no numbness and no palpitations.    Past Medical History:  Diagnosis Date  . DVT (deep venous thrombosis) (HCC)   . Hernia, hiatal   . Hypertension     Patient Active Problem List   Diagnosis Date Noted  . Galactorrhea 04/24/2011  . Hypertension 04/24/2011    Past Surgical History:  Procedure Laterality Date  . APPENDECTOMY    . DILATION AND CURETTAGE OF UTERUS       OB History    Gravida  2   Para      Term      Preterm      AB  1   Living  1     SAB  1   TAB      Ectopic      Multiple      Live Births               Home Medications    Prior to Admission medications   Medication Sig Start Date End Date Taking? Authorizing Provider  BUPROPION HCL PO Take by mouth.    [provider]  meclizine (ANTIVERT) 25 MG tablet Take 1 tablet (25 mg total) by  mouth 3 (three) times daily as needed for dizziness. 12/13/15   Rolland Porter, MD  ondansetron (ZOFRAN ODT) 4 MG disintegrating tablet Take 1 tablet (4 mg total) by mouth every 8 (eight) hours as needed for nausea. 12/13/15   Rolland Porter, MD  triamterene-hydrochlorothiazide (DYAZIDE) 37.5-25 MG per capsule Take 1 capsule by mouth daily.    [provider]    Family History Family History  Problem Relation Age of Onset  . Hypertension Mother   . Diabetes Mother   . Diabetes Maternal Aunt   . Hypertension Maternal Aunt   . Diabetes Maternal Aunt   . Hypertension Maternal Aunt     Social History Social History   Tobacco Use  . Smoking status: Never Smoker  . Smokeless tobacco: Never Used  Substance Use Topics  . Alcohol use: Yes    Comment: occ  . Drug use: No     Allergies   Ivp dye [iodinated diagnostic agents]   Review of Systems Review of Systems  Constitutional: Negative for chills, diaphoresis, fatigue and fever.  HENT: Negative for congestion.   Eyes: Negative for visual disturbance.  Respiratory: Positive for chest tightness and shortness of breath. Negative for cough and wheezing.  Cardiovascular: Positive for chest pain. Negative for palpitations and leg swelling.  Gastrointestinal: Positive for abdominal pain, nausea and vomiting. Negative for abdominal distention, anorexia, constipation and diarrhea.  Genitourinary: Negative for dysuria, flank pain and frequency.  Musculoskeletal: Negative for back pain, neck pain and neck stiffness.  Skin: Negative for rash and wound.  Neurological: Negative for light-headedness and numbness.  Psychiatric/Behavioral: Negative for agitation.  All other systems reviewed and are negative.    Physical Exam Updated Vital Signs BP (!) 164/102   Pulse 70   Temp 99.2 F (37.3 C) (Oral)   Resp 15   Ht  (1.803 m)   Wt 121.1 kg (267 lb)   LMP 10/11/2017   SpO2 98%   BMI 37.24 kg/m   Physical Exam    Constitutional: She is oriented to person, place, and time. She appears well-developed and well-nourished.  Non-toxic appearance. She does not appear ill. No distress.  HENT:  Head: Normocephalic and atraumatic.  Nose: Nose normal.  Mouth/Throat: Oropharynx is clear and moist. No oropharyngeal exudate.  Eyes: Pupils are equal, round, and reactive to light. Conjunctivae and EOM are normal. No scleral icterus.  Neck: Neck supple.  Cardiovascular: Regular rhythm. Tachycardia present.  No murmur heard. Pulmonary/Chest: Effort normal and breath sounds normal. No respiratory distress. She has no wheezes. She has no rales. She exhibits no tenderness.  Abdominal: Soft. There is tenderness in the right upper quadrant and epigastric area. There is no rigidity, no rebound and no CVA tenderness.  Musculoskeletal: She exhibits no edema or tenderness.  Neurological: She is alert and oriented to person, place, and time. No sensory deficit. She exhibits normal muscle tone.  Skin: Skin is warm and dry. Capillary refill takes less than 2 seconds. No rash noted. She is not diaphoretic. No erythema.  Psychiatric: She has a normal mood and affect.  Nursing note and vitals reviewed.    ED Treatments / Results  Labs (all labs ordered are listed, but only abnormal results are displayed) Labs Reviewed  COMPREHENSIVE METABOLIC PANEL - Abnormal; Notable for the following components:      Result Value   Potassium 3.3 (*)    Calcium 8.6 (*)    All other components within normal limits  URINALYSIS, ROUTINE W REFLEX MICROSCOPIC - Abnormal; Notable for the following components:   Color, Urine RED (*)    APPearance TURBID (*)    Hgb urine dipstick LARGE (*)    Ketones, ur >80 (*)    Protein, ur 30 (*)    Leukocytes, UA TRACE (*)    All other components within normal limits  D-DIMER, QUANTITATIVE (NOT AT [facility]) - Abnormal; Notable for the following components:   D-Dimer, Quant 1.71 (*)    All other components  within normal limits  URINALYSIS, MICROSCOPIC (REFLEX) - Abnormal; Notable for the following components:   Bacteria, UA FEW (*)    All other components within normal limits  CULTURE, BLOOD (ROUTINE X 2)  CULTURE, BLOOD (ROUTINE X 2)  LIPASE, BLOOD  CBC  PREGNANCY, URINE  TROPONIN I  TROPONIN I    EKG EKG Interpretation  Date/Time:  Thursday Oct 11 2017 22:37:12 EDT Ventricular Rate:  71 PR Interval:    QRS Duration: 78 QT Interval:  402 QTC Calculation: 437 R Axis:   30 Text Interpretation:  Sinus rhythm Low voltage, precordial leads When compared to prior, no significant changes seen.  No STEMI Confirmed by Theda Belfast ([id number]) on 10/11/2017 11:27:38 PM   Radiology  US Abdomen Limited Ruq  Result Date: 10/11/2017 CLINICAL DATA:  Right upper quadrant abdominal pain today. EXAM: ULTRASOUND ABDOMEN LIMITED RIGHT UPPER QUADRANT COMPARISON:  Right upper quadrant ultrasound 10/02/2015, 06/24/2015 FINDINGS: Gallbladder: Physiologically distended. No gallstones or wall thickening visualized. Equivocal sliver pericholecystic fluid on a single image. No sonographic Murphy sign noted by sonographer. Common bile duct: Diameter: 5 mm, normal. Liver: Within the left lobe is an echogenic 2.3 x 1.5 x 1.6 cm lesion. Additional smaller echogenic foci in the left hepatic lobe, which is slightly heterogeneous. Portal vein is patent on color Doppler imaging with normal direction of blood flow towards the liver. IMPRESSION: 1. No gallstones or gallbladder wall thickening. Equivocal sliver pericholecystic fluid on a single image is nonspecific, and may simply be artifact. 2. Echogenic 2.3 cm focus in the left lobe of the liver, which may represent a hemangioma, however this was not seen on prior ultrasounds. Additionally left lobe is slightly heterogeneous with scattered echogenic areas. Recommend further evaluation with hepatic protocol MRI if patient is able to tolerate breath hold technique. Electronically  Signed   By: Rubye Oaks M.D.   On: 10/11/2017 23:43    Procedures Procedures (including critical care time)  Medications Ordered in ED Medications  ondansetron (ZOFRAN) injection 4 mg (has no administration in time range)  ondansetron (ZOFRAN) injection 4 mg (4 mg Intravenous Given 10/11/17 2045)  gi cocktail (Maalox,Lidocaine,Donnatal) (30 mLs Oral Given 10/11/17 2045)  morphine 4 MG/ML injection 4 mg (4 mg Intravenous Given 10/11/17 2140)  HYDROmorphone (DILAUDID) injection 1 mg (1 mg Intravenous Given 10/11/17 2235)  ondansetron (ZOFRAN) injection 4 mg (4 mg Intravenous Given 10/11/17 2235)  sodium chloride 0.9 % bolus 1,000 mL (0 mLs Intravenous Stopped 10/11/17 2306)  HYDROmorphone (DILAUDID) injection 1 mg (1 mg Intravenous Given 10/12/17 0004)     Initial Impression / Assessment and Plan / ED Course  I have reviewed the triage vital signs and the nursing notes.  Pertinent labs & imaging results that were available during my care of the patient were reviewed by me and considered in my medical decision making (see chart for details).     SHAVY BEACHEM is a 42 y.o. female with a past medical history significant for hypertension and prior DVT who presents with chest pain, shortness of breath, abdominal pain, nausea, and vomiting.  Patient reports that for the last few days she has had severe chest pain and abdominal pain.  She says that she has had nausea vomiting and burping.  She says that she went to an urgent care earlier today and received a GI cocktail as well as x-rays of the chest and abdomen which were reassuring.  She says the GI cocktail did not help significantly.  She says that she also blood work done and was told to come to the ED after her d-dimer returned elevated.  She says that she had DVT years ago and has not been on anticoagulation since then.  She describes her chest pain as pressure, tightness, and sharpness across her chest.  She describes as 10 out of 10 in  severity and it is worse with exertion and deep breathing.  She does not have a history of PE in the past.  She denies recent congestion or cough or hemoptysis.  She describes her abdominal pain as tightness and cramping in the right upper quadrant epigastrium.  She does say that years ago she had sludge in her gallbladder but was never diagnosed with acute cholecystitis.  She has had her appendix out.  On exam, patient has exquisite tenderness in her epigastrium and right upper quadrant.  Patient had clear lungs and chest was nontender on my palpation.  Patient's exam was otherwise reassuring aside from elevated blood pressure.  EKG did not show STEMI.  Initial troponin was negative.  D-dimer was repeated to look for lab abnormality however it was elevated at 1.7.  Patient has an allergy to IV contrast dye and will likely need VQ scan to rule out PE given her pleuritic chest pain, history of DVT and elevated d-dimer.  Denies any urinary symptoms, with no evidence of nitrates do not feel she has a UTI.  Given the tenderness in the right upper quadrant I was concerned about gallbladder pathology with her history of sludge.  Ultrasound was ordered and did not show acute cholecystitis but did show abnormalities in the liver including several areas of abnormality that radiology recommends MRI of the liver to further evaluate.  Patient continued to require multiple dose of pain medicine and nausea medicine and fluids during her stay due to symptoms.  Patient will be transferred ED to ED for the VQ scan and hepatic MRI as well as continue her pain and nausea medications.  If patient is feeling much better and her work-up was completely reassuring, anticipate she may be safe for discharge home however if ab normalities were found with her severe symptoms patient may require admission for maintaining hydration as she has been unable to tolerate any oral intake during her ED stay.  Patient was accepted by  Dr. Patria Mane at [facility] for ED to ED transfer.  Patient's MRI and V/Q orders were placed.  Anticipate reassessment after imaging.   Final Clinical Impressions(s) / ED Diagnoses   Final diagnoses:  RUQ abdominal pain  Precordial pain  Positive D dimer  Intractable vomiting with nausea, unspecified vomiting type    Clinical Impression: 1. Precordial pain   2. RUQ abdominal pain   3. Positive D dimer   4. Intractable vomiting with nausea, unspecified vomiting type     Disposition: Patient will be transferred to [facility] for further evaluation and images not available at this facility.  This note was prepared with assistance of Conservation officer, historic buildings. Occasional wrong-word or sound-a-like substitutions may have occurred due to the inherent limitations of voice recognition software.     Dominque Levandowski, Canary Brim, MD 10/12/17 [phone]

## 2017-10-12 ENCOUNTER — Emergency Department (HOSPITAL_COMMUNITY): Payer: BLUE CROSS/BLUE SHIELD

## 2017-10-12 ENCOUNTER — Encounter (HOSPITAL_COMMUNITY): Payer: Self-pay | Admitting: Internal Medicine

## 2017-10-12 DIAGNOSIS — K5900 Constipation, unspecified: Secondary | ICD-10-CM | POA: Diagnosis not present

## 2017-10-12 DIAGNOSIS — Z8249 Family history of ischemic heart disease and other diseases of the circulatory system: Secondary | ICD-10-CM | POA: Diagnosis not present

## 2017-10-12 DIAGNOSIS — R000 Tachycardia, unspecified: Secondary | ICD-10-CM | POA: Diagnosis present

## 2017-10-12 DIAGNOSIS — I81 Portal vein thrombosis: Secondary | ICD-10-CM | POA: Diagnosis not present

## 2017-10-12 DIAGNOSIS — E876 Hypokalemia: Secondary | ICD-10-CM | POA: Diagnosis not present

## 2017-10-12 DIAGNOSIS — Z9049 Acquired absence of other specified parts of digestive tract: Secondary | ICD-10-CM | POA: Diagnosis not present

## 2017-10-12 DIAGNOSIS — Z91041 Radiographic dye allergy status: Secondary | ICD-10-CM | POA: Diagnosis not present

## 2017-10-12 DIAGNOSIS — Z8759 Personal history of other complications of pregnancy, childbirth and the puerperium: Secondary | ICD-10-CM | POA: Diagnosis not present

## 2017-10-12 DIAGNOSIS — R1013 Epigastric pain: Secondary | ICD-10-CM | POA: Diagnosis present

## 2017-10-12 DIAGNOSIS — I10 Essential (primary) hypertension: Secondary | ICD-10-CM | POA: Diagnosis not present

## 2017-10-12 DIAGNOSIS — R8271 Bacteriuria: Secondary | ICD-10-CM | POA: Diagnosis present

## 2017-10-12 DIAGNOSIS — Z833 Family history of diabetes mellitus: Secondary | ICD-10-CM | POA: Diagnosis not present

## 2017-10-12 HISTORY — DX: Portal vein thrombosis: I81

## 2017-10-12 LAB — HEPARIN LEVEL (UNFRACTIONATED)
Heparin Unfractionated: 0.56 IU/mL (ref 0.30–0.70)
Heparin Unfractionated: 0.45 IU/mL (ref 0.30–0.70)

## 2017-10-12 LAB — TROPONIN I: Troponin I: <0.03 ng/mL (ref <0.03)

## 2017-10-12 MED ORDER — POTASSIUM CHLORIDE 10 MEQ/100ML IV SOLN
10.0000 meq | INTRAVENOUS | Status: AC
  Administered 2017-10-12 (×2): 10 meq via INTRAVENOUS
  Filled 2017-10-12 (×2): qty 100

## 2017-10-12 MED ORDER — ACETAMINOPHEN 650 MG RE SUPP
650.0000 mg | Freq: Four times a day (QID) | RECTAL | Status: DC | PRN
Start: 2017-10-12 — End: 2017-10-15

## 2017-10-12 MED ORDER — HYDROMORPHONE HCL 2 MG/ML IJ SOLN
1.0000 mg | Freq: Once | INTRAMUSCULAR | Status: AC
  Administered 2017-10-12: 1 mg via INTRAVENOUS
  Filled 2017-10-12: qty 1

## 2017-10-12 MED ORDER — ACETAMINOPHEN 325 MG PO TABS
650.0000 mg | ORAL_TABLET | Freq: Four times a day (QID) | ORAL | Status: DC | PRN
Start: 2017-10-12 — End: 2017-10-15
  Administered 2017-10-12: 650 mg via ORAL
  Filled 2017-10-12: qty 2

## 2017-10-12 MED ORDER — SODIUM CHLORIDE 0.9 % IV SOLN
INTRAVENOUS | Status: DC
  Administered 2017-10-12: 06:00:00 via INTRAVENOUS

## 2017-10-12 MED ORDER — OXYCODONE HCL 5 MG PO TABS: 5.0000 mg | ORAL_TABLET | ORAL | Status: DC | PRN

## 2017-10-12 MED ORDER — METOCLOPRAMIDE HCL 5 MG/ML IJ SOLN
10.0000 mg | Freq: Once | INTRAMUSCULAR | Status: AC
  Administered 2017-10-12: 10 mg via INTRAVENOUS
  Filled 2017-10-12: qty 2

## 2017-10-12 MED ORDER — IOPAMIDOL (ISOVUE-370) INJECTION 76%
INTRAVENOUS | Status: AC
  Filled 2017-10-12: qty 100

## 2017-10-12 MED ORDER — HEPARIN (PORCINE) IN NACL 100-0.45 UNIT/ML-% IJ SOLN
1500.0000 [IU]/h | INTRAMUSCULAR | Status: DC
  Administered 2017-10-12: 1600 [IU]/h via INTRAVENOUS
  Administered 2017-10-14: 1500 [IU]/h via INTRAVENOUS
  Administered 2017-10-14: 1600 [IU]/h via INTRAVENOUS
  Filled 2017-10-12 (×5): qty 250

## 2017-10-12 MED ORDER — ONDANSETRON HCL 4 MG PO TABS
4.0000 mg | ORAL_TABLET | Freq: Four times a day (QID) | ORAL | Status: DC | PRN
Start: 2017-10-12 — End: 2017-10-15
  Administered 2017-10-14 (×2): 4 mg via ORAL
  Filled 2017-10-12 (×3): qty 1

## 2017-10-12 MED ORDER — HYDROMORPHONE HCL 2 MG/ML IJ SOLN
1.0000 mg | INTRAMUSCULAR | Status: AC | PRN
  Administered 2017-10-12: 1 mg via INTRAVENOUS
  Filled 2017-10-12: qty 1

## 2017-10-12 MED ORDER — HYDRALAZINE HCL 20 MG/ML IJ SOLN: 5.0000 mg | INTRAMUSCULAR | Status: DC | PRN

## 2017-10-12 MED ORDER — IOPAMIDOL (ISOVUE-370) INJECTION 76%
100.0000 mL | Freq: Once | INTRAVENOUS | Status: AC | PRN
  Administered 2017-10-12: 100 mL via INTRAVENOUS

## 2017-10-12 MED ORDER — TRAZODONE HCL 50 MG PO TABS
50.0000 mg | ORAL_TABLET | Freq: Every evening | ORAL | Status: DC | PRN
  Administered 2017-10-12 – 2017-10-14 (×3): 50 mg via ORAL
  Filled 2017-10-12 (×3): qty 1

## 2017-10-12 MED ORDER — SODIUM CHLORIDE 0.9 % IV SOLN
1.0000 g | Freq: Every day | INTRAVENOUS | Status: DC
  Administered 2017-10-12: 1 g via INTRAVENOUS
  Filled 2017-10-12: qty 10

## 2017-10-12 MED ORDER — HYDROMORPHONE HCL 1 MG/ML IJ SOLN
1.0000 mg | INTRAMUSCULAR | Status: DC | PRN
  Administered 2017-10-12 – 2017-10-15 (×12): 1 mg via INTRAVENOUS
  Filled 2017-10-12 (×12): qty 1

## 2017-10-12 MED ORDER — ONDANSETRON HCL 4 MG/2ML IJ SOLN
4.0000 mg | Freq: Four times a day (QID) | INTRAMUSCULAR | Status: DC | PRN
  Administered 2017-10-12 – 2017-10-13 (×4): 4 mg via INTRAVENOUS
  Filled 2017-10-12 (×4): qty 2

## 2017-10-12 MED ORDER — MORPHINE SULFATE (PF) 4 MG/ML IV SOLN
1.0000 mg | INTRAVENOUS | Status: DC | PRN
  Administered 2017-10-12: 1 mg via INTRAVENOUS
  Filled 2017-10-12: qty 1

## 2017-10-12 MED ORDER — OXYCODONE HCL 5 MG PO TABS
5.0000 mg | ORAL_TABLET | ORAL | Status: AC | PRN
  Administered 2017-10-12: 5 mg via ORAL
  Filled 2017-10-12: qty 1

## 2017-10-12 MED ORDER — METOPROLOL SUCCINATE ER 25 MG PO TB24
25.0000 mg | ORAL_TABLET | Freq: Every day | ORAL | Status: DC
  Administered 2017-10-12 – 2017-10-15 (×4): 25 mg via ORAL
  Filled 2017-10-12 (×4): qty 1

## 2017-10-12 MED ORDER — ONDANSETRON HCL 4 MG/2ML IJ SOLN
4.0000 mg | Freq: Once | INTRAMUSCULAR | Status: DC
  Filled 2017-10-12: qty 2

## 2017-10-12 MED ORDER — HEPARIN BOLUS VIA INFUSION
4000.0000 [IU] | Freq: Once | INTRAVENOUS | Status: AC
  Administered 2017-10-12: 4000 [IU] via INTRAVENOUS
  Filled 2017-10-12: qty 4000

## 2017-10-12 NOTE — Progress Notes (Signed)
ANTICOAGULATION CONSULT NOTE  Pharmacy Consult for Heparin Indication: portal vein thrombosis  Allergies  Allergen Reactions  . Ivp Dye [Iodinated Diagnostic Agents] Nausea And Vomiting    Patient Measurements: Height:  (180.3 cm) Weight: 267 lb (121.1 kg) IBW/kg (Calculated) : 70.8 Heparin Dosing Weight: 100 kg  Vital Signs: BP: 143/101 (05/24 1200) Pulse Rate: 67 (05/24 1315)  Labs: Recent Labs    10/11/17 2041 10/11/17 2043 10/12/17 0051 10/12/17 1223  HGB 13.9  --   --   --   HCT 41.1  --   --   --   PLT 215  --   --   --   HEPARINUNFRC  --   --   --  0.56  CREATININE 0.89  --   --   --   TROPONINI  --  <0.03 <0.03  --     Estimated Creatinine Clearance: 121.8 mL/min (by C-G formula based on SCr of 0.89 mg/dL).  Assessment: 42 y.o. female with portal vein thrombosis continues on IV heparin. Initial level is therapeutic and no bleeding noted.   Goal of Therapy:  Heparin level 0.3-0.7 units/ml Monitor platelets by anticoagulation protocol: Yes   Plan:  Continue heparin gtt 1600 units/hr Confirm dosing with PM heparin level Daily heparin level and CBC  Jarrah Seher, Drake Leach 10/12/2017,1:21 PM

## 2017-10-12 NOTE — ED Provider Notes (Signed)
Patient with evidence on CT imaging of an acute left portal vein thrombosis with extension.  She will need anticoagulation.  Will initiate the patient on heparin at this time.  She continues to have profound epigastric discomfort and has had one episode of nonbloody nausea vomiting here in the emergency department.  Anticoagulation and admission to the hospital now.  No evidence of pulmonary embolism.  I discussed the radiographic findings with the radiologist.  I personally reviewed the patient's CT images  Consultations: [facility]  Disposition: Admission  Treatment in the emergency department: Initiation of heparin drip, 10 mg IV Reglan for nausea, 1 mg IV Dilaudid for pain   Azalia Bilis, MD 10/12/17 Jeralyn Bennett

## 2017-10-12 NOTE — H&P (Signed)
History and Physical    Brenda Michael:[id number] DOB: Nov 10, 1975 DOA: 10/11/2017  PCP: Shellia Cleverly, PA  Patient coming from: Home.  Chief Complaint: Abdominal pain.  HPI: Brenda Michael is a 42 y.o. female with history of hypertension presents to the ER with complaints of abdominal pain.  Patient has been having abdominal pain for last 2 days with nausea.  Pain is mostly in the epigastric area stabbing in nature severe at times radiating.  Denies any diarrhea.  Denies fever chills.  Had gone to the urgent care and had a d-dimer done which was elevated and was referred to the ER.  Patient states she has had a diagnosis of DVT previously 4 years ago and was told that it was chronic.  ED Course: In the ER patient had CT angiogram of the chest and abdomen.  It showed portal vein thrombosis.  Patient was started on heparin and admitted for further management.  Patient's pain improved with pain relief medications.  UA shows possibility of UTI with ketosis.  Patient is not using any oral contraceptives.  Review of Systems: As per HPI, rest all negative.   Past Medical History:  Diagnosis Date  . DVT (deep venous thrombosis) (HCC)   . Hernia, hiatal   . Hypertension     Past Surgical History:  Procedure Laterality Date  . APPENDECTOMY    . DILATION AND CURETTAGE OF UTERUS       reports that she has never smoked. She has never used smokeless tobacco. She reports that she drinks alcohol. She reports that she does not use drugs.  No Active Allergies  Family History  Problem Relation Age of Onset  . Hypertension Mother   . Diabetes Mother   . Diabetes Maternal Aunt   . Hypertension Maternal Aunt   . Diabetes Maternal Aunt   . Hypertension Maternal Aunt     Prior to Admission medications   Medication Sig Start Date End Date Taking? Authorizing Provider  BUPROPION HCL PO Take by mouth.    [provider]  meclizine (ANTIVERT) 25 MG tablet Take 1 tablet (25 mg total)  by mouth 3 (three) times daily as needed for dizziness. 12/13/15   Rolland Porter, MD  ondansetron (ZOFRAN ODT) 4 MG disintegrating tablet Take 1 tablet (4 mg total) by mouth every 8 (eight) hours as needed for nausea. 12/13/15   Rolland Porter, MD  triamterene-hydrochlorothiazide (DYAZIDE) 37.5-25 MG per capsule Take 1 capsule by mouth daily.    [provider]    Physical Exam: Vitals:   10/12/17 0030 10/12/17 0300 10/12/17 0330 10/12/17 0500  BP: (!) 137/96 (!) 151/99 (!) 139/93 (!) 160/103  Pulse: 66 71 73 72  Resp: 20 19 (!) 21 19  Temp:      TempSrc:      SpO2: 95% 98% 97% 97%  Weight:      Height:          Constitutional: Moderately built and nourished. Vitals:   10/12/17 0030 10/12/17 0300 10/12/17 0330 10/12/17 0500  BP: (!) 137/96 (!) 151/99 (!) 139/93 (!) 160/103  Pulse: 66 71 73 72  Resp: 20 19 (!) 21 19  Temp:      TempSrc:      SpO2: 95% 98% 97% 97%  Weight:      Height:       Eyes: Anicteric no pallor. ENMT: No discharge from the ears eyes nose or mouth. Neck: No mass felt.  No neck rigidity.  No JVD appreciated. Respiratory: No rhonchi or crepitations. Cardiovascular: S1-S2 heard no murmurs appreciated. Abdomen: Mildly tender diffusely no guarding rigidity bowel sounds present. Musculoskeletal: No edema.  No joint effusion. Skin: No rash. Neurologic: Alert awake oriented to time place and person.  Moves all extremities. Psychiatric: Appears normal per normal affect.   Labs on Admission: I have personally reviewed following labs and imaging studies  CBC: Recent Labs  Lab 10/11/17 2041  WBC 9.7  HGB 13.9  HCT 41.1  MCV 87.8  PLT 215   Basic Metabolic Panel: Recent Labs  Lab 10/11/17 2041  NA 137  K 3.3*  CL 103  CO2 25  GLUCOSE 92  BUN 9  CREATININE 0.89  CALCIUM 8.6*   GFR: Estimated Creatinine Clearance: 121.8 mL/min (by C-G formula based on SCr of 0.89 mg/dL). Liver Function Tests: Recent Labs  Lab 10/11/17 2041  AST 19    ALT 21  ALKPHOS 69  BILITOT 0.8  PROT 7.5  ALBUMIN 3.6   Recent Labs  Lab 10/11/17 2041  LIPASE 30   No results for input(s): AMMONIA in the last 168 hours. Coagulation Profile: No results for input(s): INR, PROTIME in the last 168 hours. Cardiac Enzymes: Recent Labs  Lab 10/11/17 2043 10/12/17 0051  TROPONINI <0.03 <0.03   BNP (last 3 results) No results for input(s): PROBNP in the last 8760 hours. HbA1C: No results for input(s): HGBA1C in the last 72 hours. CBG: No results for input(s): GLUCAP in the last 168 hours. Lipid Profile: No results for input(s): CHOL, HDL, LDLCALC, TRIG, CHOLHDL, LDLDIRECT in the last 72 hours. Thyroid Function Tests: No results for input(s): TSH, T4TOTAL, FREET4, T3FREE, THYROIDAB in the last 72 hours. Anemia Panel: No results for input(s): VITAMINB12, FOLATE, FERRITIN, TIBC, IRON, RETICCTPCT in the last 72 hours. Urine analysis:    Component Value Date/Time   COLORURINE RED (A) 10/11/2017 2043   APPEARANCEUR TURBID (A) 10/11/2017 2043   LABSPEC 1.020 10/11/2017 2043   PHURINE 7.0 10/11/2017 2043   GLUCOSEU NEGATIVE 10/11/2017 2043   HGBUR LARGE (A) 10/11/2017 2043   BILIRUBINUR NEGATIVE 10/11/2017 2043   KETONESUR >80 (A) 10/11/2017 2043   PROTEINUR 30 (A) 10/11/2017 2043   UROBILINOGEN 1.0 12/28/2014 2000   NITRITE NEGATIVE 10/11/2017 2043   LEUKOCYTESUR TRACE (A) 10/11/2017 2043   Sepsis Labs: (procalcitonin:4,lacticidven:4) )No results found for this or any previous visit (from the past 240 hour(s)).   Radiological Exams on Admission: Ct Angio Chest Pe W And/or Wo Contrast  Result Date: 10/12/2017 CLINICAL DATA:  Epigastric pain. Elevated D-dimer. PE suspected, intermediate prob, positive D-dimer EXAM: CT ANGIOGRAPHY CHEST WITH CONTRAST TECHNIQUE: Multidetector CT imaging of the chest was performed using the standard protocol during bolus administration of intravenous contrast. Multiplanar CT image reconstructions  and MIPs were obtained to evaluate the vascular anatomy. CONTRAST:  ISOVUE-370 IOPAMIDOL (ISOVUE-370) INJECTION 76% COMPARISON:  Chest radiograph yesterday. FINDINGS: Cardiovascular: There are no filling defects within the pulmonary arteries to the segmental level to suggest pulmonary embolus. Cannot assess subsegmental branches due to contrast bolus timing and soft tissue attenuation from habitus. Heart size normal. Minimal pericardial fluid anteriorly. Thoracic aorta is normal in caliber without dissection. Mediastinum/Nodes: No enlarged mediastinal or hilar lymph nodes. Esophagus is decompressed. No thyroid nodule. Lungs/Pleura: Clear lungs. No consolidation. No pleural fluid. No pulmonary edema. No pulmonary mass or suspicious nodule. Upper Abdomen: Assessed in greater detail on concurrent abdominal CT. Musculoskeletal: Minimal scoliotic curvature of the thoracic spine. There are no acute  or suspicious osseous abnormalities. Review of the MIP images confirms the above findings. IMPRESSION: No pulmonary embolus or acute intrathoracic abnormality. Electronically Signed   By: Rubye Oaks M.D.   On: 10/12/2017 05:01   Ct Abdomen Pelvis W Contrast  Result Date: 10/12/2017 CLINICAL DATA:  Epigastric abdominal pain and belching. EXAM: CT ABDOMEN AND PELVIS WITH CONTRAST TECHNIQUE: Multidetector CT imaging of the abdomen and pelvis was performed using the standard protocol following bolus administration of intravenous contrast. CONTRAST:  ISOVUE-370 IOPAMIDOL (ISOVUE-370) INJECTION 76% COMPARISON:  Right upper quadrant ultrasound yesterday. FINDINGS: Lower chest: Lung bases are clear, better assessed on concurrent chest CT. Hepatobiliary: Complete thrombosis of the left portal vein, with filling defect extending to the confluence of the right and left portal vein. No focal liver lesion, particularly corresponding to the echogenic focus on ultrasound. Gallbladder physiologically distended, no  calcified stone. No biliary dilatation. No pericholecystic fluid. Pancreas: No ductal dilatation or inflammation. Spleen: Normal in size without focal abnormality. Adrenals/Urinary Tract: Normal adrenal glands. No hydronephrosis or perinephric edema. Homogeneous renal enhancement. Urinary bladder is physiologically distended without wall thickening. Stomach/Bowel: Stomach physiologically distended. No gastric wall thickening. No small bowel wall thickening, inflammatory change or obstruction. Prior appendectomy. Scattered colonic diverticulosis without diverticulitis. Vascular/Lymphatic: Left portal vein and branches are completely occluded. Thrombus extends to the junction of the left and right portal vein, no extension to the main or extrahepatic portal vein. Splenic and mesenteric veins are patent. Minimal atherosclerosis of iliac vessels. No aneurysm. No enlarged abdominal or pelvic lymph nodes. Reproductive: The uterine fundus slightly bulbous, query fibroid. Left ovary is normal in size. Right ovary is not well visualized by CT. Other: No free air or ascites. No intra-abdominal abscess. Minimal fat in the left inguinal canal. Musculoskeletal: There are no acute or suspicious osseous abnormalities. IMPRESSION: 1. Complete occlusion of the left portal vein. Thrombus extends to the junction of the right and left intrahepatic portal venous system. 2. Slightly bulbous appearance of the uterus which can be seen with fibroids. These results were called by telephone at the time of interpretation on 10/12/2017 at 5:10 am to Dr. Azalia Bilis , who verbally acknowledged these results. Electronically Signed   By: Rubye Oaks M.D.   On: 10/12/2017 05:11   US Abdomen Limited Ruq  Result Date: 10/11/2017 CLINICAL DATA:  Right upper quadrant abdominal pain today. EXAM: ULTRASOUND ABDOMEN LIMITED RIGHT UPPER QUADRANT COMPARISON:  Right upper quadrant ultrasound 10/02/2015, 06/24/2015 FINDINGS: Gallbladder:  Physiologically distended. No gallstones or wall thickening visualized. Equivocal sliver pericholecystic fluid on a single image. No sonographic Murphy sign noted by sonographer. Common bile duct: Diameter: 5 mm, normal. Liver: Within the left lobe is an echogenic 2.3 x 1.5 x 1.6 cm lesion. Additional smaller echogenic foci in the left hepatic lobe, which is slightly heterogeneous. Portal vein is patent on color Doppler imaging with normal direction of blood flow towards the liver. IMPRESSION: 1. No gallstones or gallbladder wall thickening. Equivocal sliver pericholecystic fluid on a single image is nonspecific, and may simply be artifact. 2. Echogenic 2.3 cm focus in the left lobe of the liver, which may represent a hemangioma, however this was not seen on prior ultrasounds. Additionally left lobe is slightly heterogeneous with scattered echogenic areas. Recommend further evaluation with hepatic protocol MRI if patient is able to tolerate breath hold technique. Electronically Signed   By: Rubye Oaks M.D.   On: 10/11/2017 23:43    EKG: Independently reviewed.  Normal sinus rhythm.  Assessment/Plan  Principal Problem:   Portal vein thrombosis Active Problems:   Hypertension    1. Portal vein thrombosis -patient has been started on heparin.  May slowly transition to oral anticoagulants once patient is able to tolerate orally.  May discuss with gastroenterologist in a.m. for further recommendation.  Continue hydration pain relief medications.  Recheck LFTs and lipase.  Patient has had previous history of deep vein thrombus of the lower extremity which was told to be chronic.   2. Hypertension -since patient is receiving IV fluids we will hold patient's diuretics and keep patient on PRN IV hydralazine for blood pressure.  Closely follow blood pressure trends. 3. Mild hypokalemia likely from diuretics and nausea.  Replace and recheck.   DVT prophylaxis: Heparin. Code Status: Full code. Family  Communication: Discussed with patient. Disposition Plan: Home. Consults called: None. Admission status: Inpatient.   Eduard Clos MD [facility] Pager [phone].  If 7PM-7AM, please contact night-coverage [email or web address] Password [facility]  10/12/2017, 5:45 AM

## 2017-10-12 NOTE — ED Notes (Signed)
Spoke to [employer] from [employer]

## 2017-10-12 NOTE — Progress Notes (Signed)
Patient arrived on the unit via wheelchair from [facility]. Patient is A&O x4 and oriented to room. No telemetry ordered at this time. Call bell within reach. Will continue to monitor.   Jaynee Eagles, RN

## 2017-10-12 NOTE — ED Notes (Addendum)
[facility] notified Selena Batten) - transfer patient to [facility] ED - Dr. Patria Mane accepting

## 2017-10-12 NOTE — Progress Notes (Signed)
PROGRESS NOTE    Brenda Michael  ZOX:[id number] DOB: January 06, 1976 DOA: 10/11/2017 PCP: Shellia Cleverly, PA      Brief Narrative:  Brenda Michael is a 42 y.o. 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   Assessment & Plan:  Portal vein thrombosis Patient with acute epigastric pain and vomiting.  CT shows occlusion of the left portal vein, patent main portal vein and right portal vein.  No previous history of cirrhosis, no pancreatitis, no other obvious inciting factor.    Initial [place] images suggested a liver lesion, this was discussed with Radiology, who are clear that the follow up CT rules OUT a liver lesion, likely this was misinterpretation of the thrombosed left portal vein.    The patient has history of DVT she reports, although I see no records to confirm this (no previous LE ultrasounds here, the only ones in [facility] were from Sep 2016 and they showed no acute or chornic thrombus).  Furthermore, a 2016 appointment with her PCP for leg swelling at which the bilateral LE [place] was ordered makes NO mention of a previous clot in her legs.  She does report a family history of multiple "clots" in her mother, but subsequently is unable to differentiate for me if her mother had DVT/PE or aneurysm or stroke (and her [facility] chart makes no indication that anyone in the family has a history of VTE at all).  If she has no confirmed previous clot, would anticoagulate for 6 months, then stop. -Continue heparin -Consult to case management regarding coverage of nOAC -Hydromorphone 1 mg p.r.n. q4 hrs for pain, or oxycodone -Stop OCP   Hypertension -Continue metoprolol  Leukocyturia -Stop ceftriaxone, eval for symptoms, monitor urine culture  Hypokalemia Supplemented -Repeat BMP     DVT prophylaxis: N/A Code Status: FULL Family Communication: None present MDM and disposition  Plan: The below labs and imaging reports were reviewed summarized above..  The patient's status is clinically stable.  This is a no charge note, for futher details, please see H&P by my partenr Dr. Toniann Fail from earlier today.  She presents with acute abdominal pain, found to have spontaenous portal vein thrombosis.  Anticoagulation started.  Will attempt pain control, idscuss follow up with GI, likely home in 48 hours.  Consultants:   None  Procedures:   None  Antimicrobials:   CTX x1    Subjective: Epigastric pain is still present, pain medicine helps.  Nausea is mild right now.  She has no appetite.  No leg swelling, chest pain, dyspena.  Objective: Vitals:   10/12/17 0630 10/12/17 0900 10/12/17 0922 10/12/17 0945  BP: (!) 139/95 (!) 142/94 (!) 127/91   Pulse: 65 66  63  Resp: Temp:      TempSrc:      SpO2: 96% 100% 100% 99%  Weight:      Height:        Intake/Output Summary (Last 24 hours) at 10/12/2017 1051 Last data filed at 10/12/2017 0838 Gross per 24 hour  Intake 1299.75 ml  Output -  Net 1299.75 ml   Filed Weights   10/11/17 2026  Weight: 121.1 kg (267 lb)    Examination: Patient was seen and examined.    Data Reviewed: I have personally reviewed following labs and imaging studies:  CBC: Recent Labs  Lab 10/11/17 2041  WBC 9.7  HGB 13.9  HCT 41.1  MCV 87.8  PLT 215   Basic Metabolic Panel: Recent Labs  Lab 10/11/17 2041  NA 137  K 3.3*  CL 103  CO2 25  GLUCOSE 92  BUN 9  CREATININE 0.89  CALCIUM 8.6*   GFR: Estimated Creatinine Clearance: 121.8 mL/min (by C-G formula based on SCr of 0.89 mg/dL). Liver Function Tests: Recent Labs  Lab 10/11/17 2041  AST 19  ALT 21  ALKPHOS 69  BILITOT 0.8  PROT 7.5  ALBUMIN 3.6   Recent Labs  Lab 10/11/17 2041  LIPASE 30   No results for input(s): AMMONIA in the last 168 hours. Coagulation Profile: No results for input(s): INR, PROTIME in the last 168 hours. Cardiac  Enzymes: Recent Labs  Lab 10/11/17 2043 10/12/17 0051  TROPONINI <0.03 <0.03   BNP (last 3 results) No results for input(s): PROBNP in the last 8760 hours. HbA1C: No results for input(s): HGBA1C in the last 72 hours. CBG: No results for input(s): GLUCAP in the last 168 hours. Lipid Profile: No results for input(s): CHOL, HDL, LDLCALC, TRIG, CHOLHDL, LDLDIRECT in the last 72 hours. Thyroid Function Tests: No results for input(s): TSH, T4TOTAL, FREET4, T3FREE, THYROIDAB in the last 72 hours. Anemia Panel: No results for input(s): VITAMINB12, FOLATE, FERRITIN, TIBC, IRON, RETICCTPCT in the last 72 hours. Urine analysis:    Component Value Date/Time   COLORURINE RED (A) 10/11/2017 2043   APPEARANCEUR TURBID (A) 10/11/2017 2043   LABSPEC 1.020 10/11/2017 2043   PHURINE 7.0 10/11/2017 2043   GLUCOSEU NEGATIVE 10/11/2017 2043   HGBUR LARGE (A) 10/11/2017 2043   BILIRUBINUR NEGATIVE 10/11/2017 2043   KETONESUR >80 (A) 10/11/2017 2043   PROTEINUR 30 (A) 10/11/2017 2043   UROBILINOGEN 1.0 12/28/2014 2000   NITRITE NEGATIVE 10/11/2017 2043   LEUKOCYTESUR TRACE (A) 10/11/2017 2043   Sepsis Labs: (procalcitonin:4,lacticacidven:4)  )No results found for this or any previous visit (from the past 240 hour(s)).       Radiology Studies: Ct Angio Chest Pe W And/or Wo Contrast  Result Date: 10/12/2017 CLINICAL DATA:  Epigastric pain. Elevated D-dimer. PE suspected, intermediate prob, positive D-dimer EXAM: CT ANGIOGRAPHY CHEST WITH CONTRAST TECHNIQUE: Multidetector CT imaging of the chest was performed using the standard protocol during bolus administration of intravenous contrast. Multiplanar CT image reconstructions and MIPs were obtained to evaluate the vascular anatomy. CONTRAST:  ISOVUE-370 IOPAMIDOL (ISOVUE-370) INJECTION 76% COMPARISON:  Chest radiograph yesterday. FINDINGS: Cardiovascular: There are no filling defects within the pulmonary arteries to the segmental  level to suggest pulmonary embolus. Cannot assess subsegmental branches due to contrast bolus timing and soft tissue attenuation from habitus. Heart size normal. Minimal pericardial fluid anteriorly. Thoracic aorta is normal in caliber without dissection. Mediastinum/Nodes: No enlarged mediastinal or hilar lymph nodes. Esophagus is decompressed. No thyroid nodule. Lungs/Pleura: Clear lungs. No consolidation. No pleural fluid. No pulmonary edema. No pulmonary mass or suspicious nodule. Upper Abdomen: Assessed in greater detail on concurrent abdominal CT. Musculoskeletal: Minimal scoliotic curvature of the thoracic spine. There are no acute or suspicious osseous abnormalities. Review of the MIP images confirms the above findings. IMPRESSION: No pulmonary embolus or acute intrathoracic abnormality. Electronically Signed   By: Rubye Oaks M.D.   On: 10/12/2017 05:01   Ct Abdomen Pelvis W Contrast  Result Date: 10/12/2017 CLINICAL DATA:  Epigastric abdominal pain and belching. EXAM: CT ABDOMEN AND PELVIS WITH CONTRAST TECHNIQUE: Multidetector CT imaging of the abdomen and pelvis was performed using the standard protocol following  bolus administration of intravenous contrast. CONTRAST:  ISOVUE-370 IOPAMIDOL (ISOVUE-370) INJECTION 76% COMPARISON:  Right upper quadrant ultrasound yesterday. FINDINGS: Lower chest: Lung bases are clear, better assessed on concurrent chest CT. Hepatobiliary: Complete thrombosis of the left portal vein, with filling defect extending to the confluence of the right and left portal vein. No focal liver lesion, particularly corresponding to the echogenic focus on ultrasound. Gallbladder physiologically distended, no calcified stone. No biliary dilatation. No pericholecystic fluid. Pancreas: No ductal dilatation or inflammation. Spleen: Normal in size without focal abnormality. Adrenals/Urinary Tract: Normal adrenal glands. No hydronephrosis or perinephric edema. Homogeneous renal  enhancement. Urinary bladder is physiologically distended without wall thickening. Stomach/Bowel: Stomach physiologically distended. No gastric wall thickening. No small bowel wall thickening, inflammatory change or obstruction. Prior appendectomy. Scattered colonic diverticulosis without diverticulitis. Vascular/Lymphatic: Left portal vein and branches are completely occluded. Thrombus extends to the junction of the left and right portal vein, no extension to the main or extrahepatic portal vein. Splenic and mesenteric veins are patent. Minimal atherosclerosis of iliac vessels. No aneurysm. No enlarged abdominal or pelvic lymph nodes. Reproductive: The uterine fundus slightly bulbous, query fibroid. Left ovary is normal in size. Right ovary is not well visualized by CT. Other: No free air or ascites. No intra-abdominal abscess. Minimal fat in the left inguinal canal. Musculoskeletal: There are no acute or suspicious osseous abnormalities. IMPRESSION: 1. Complete occlusion of the left portal vein. Thrombus extends to the junction of the right and left intrahepatic portal venous system. 2. Slightly bulbous appearance of the uterus which can be seen with fibroids. These results were called by telephone at the time of interpretation on 10/12/2017 at 5:10 am to Dr. Azalia Bilis , who verbally acknowledged these results. Electronically Signed   By: Rubye Oaks M.D.   On: 10/12/2017 05:11   US Abdomen Limited Ruq  Result Date: 10/11/2017 CLINICAL DATA:  Right upper quadrant abdominal pain today. EXAM: ULTRASOUND ABDOMEN LIMITED RIGHT UPPER QUADRANT COMPARISON:  Right upper quadrant ultrasound 10/02/2015, 06/24/2015 FINDINGS: Gallbladder: Physiologically distended. No gallstones or wall thickening visualized. Equivocal sliver pericholecystic fluid on a single image. No sonographic Murphy sign noted by sonographer. Common bile duct: Diameter: 5 mm, normal. Liver: Within the left lobe is an echogenic 2.3 x 1.5 x 1.6  cm lesion. Additional smaller echogenic foci in the left hepatic lobe, which is slightly heterogeneous. Portal vein is patent on color Doppler imaging with normal direction of blood flow towards the liver. IMPRESSION: 1. No gallstones or gallbladder wall thickening. Equivocal sliver pericholecystic fluid on a single image is nonspecific, and may simply be artifact. 2. Echogenic 2.3 cm focus in the left lobe of the liver, which may represent a hemangioma, however this was not seen on prior ultrasounds. Additionally left lobe is slightly heterogeneous with scattered echogenic areas. Recommend further evaluation with hepatic protocol MRI if patient is able to tolerate breath hold technique. Electronically Signed   By: Rubye Oaks M.D.   On: 10/11/2017 23:43        Scheduled Meds: . iopamidol      . ondansetron (ZOFRAN) IV  4 mg Intravenous Once   Continuous Infusions: . sodium chloride 100 mL/hr at 10/12/17 1610  . heparin 1,600 Units/hr (10/12/17 0621)     LOS: 0 days    Time spent: 25 minutes    Alberteen Sam, MD [facility] 10/12/2017, 8:15 AM     Pager [phone] --- please page though AMION:  [email or web address] Password [email or web address] If 7PM-7AM, please contact  night-coverage

## 2017-10-12 NOTE — ED Notes (Signed)
Report called to [place], Consulting civil engineer at [facility] ED

## 2017-10-12 NOTE — Progress Notes (Signed)
BENEFIT CHECK, PER CMA Memory Argue         # 9.  Jamelle Haring JERRI  @ [place] # [phone]    1. ELIQUIS 2.5 MG BID  COVER- YES  CO-PAY- $ 191.28  TIER- 2 DRUG  PRIOR APPROVAL- NO   2. ELIQUIS 5 MG BID  COVER - YES  CO-PAY- $ 191.29  TIER- 2 DRUG  PRIOR APPROVAL- NO    3. XARELTO 15 MG BID  COVER- YES  CO-PAY- $ 392.08  TIER- 2 DRUG  PRIOR APPROVAL- NO   4. ELIQUIS 20 MG DAILY  COVER- YES  CO-PAY- $ 196.29  TIER- 2 DRUG  PRIOR APPROVAL- NO   DEDUCTIBLE HAS BEEN MET  OUT OF POCKET : NOT MET   PREFERRED PHARMACY ; [facility]

## 2017-10-12 NOTE — ED Notes (Signed)
Patient transported to CT 

## 2017-10-12 NOTE — Progress Notes (Signed)
ANTICOAGULATION CONSULT NOTE  Pharmacy Consult for Heparin Indication: portal vein thrombosis  Allergies  Allergen Reactions  . Ivp Dye [Iodinated Diagnostic Agents] Nausea And Vomiting    Patient Measurements: Height:  (180.3 cm) Weight: 267 lb (121.1 kg) IBW/kg (Calculated) : 70.8 Heparin Dosing Weight: 100 kg  Vital Signs: Temp: 98.3 F (36.8 C) (05/24 1345) Temp Source: Oral (05/24 1345) BP: 141/100 (05/24 1345) Pulse Rate: 68 (05/24 1345)  Labs: Recent Labs    10/11/17 2041 10/11/17 2043 10/12/17 0051 10/12/17 1223 10/12/17 1938  HGB 13.9  --   --   --   --   HCT 41.1  --   --   --   --   PLT 215  --   --   --   --   HEPARINUNFRC  --   --   --  0.56 0.45  CREATININE 0.89  --   --   --   --   TROPONINI  --  <0.03 <0.03  --   --     Estimated Creatinine Clearance: 121.8 mL/min (by C-G formula based on SCr of 0.89 mg/dL).  Assessment: 42 y.o. female with portal vein thrombosis continues on IV heparin.Heparin level is therapeutic and no bleeding noted.   Goal of Therapy:  Heparin level 0.3-0.7 units/ml Monitor platelets by anticoagulation protocol: Yes   Plan:  Continue heparin gtt 1600 units/hr Daily heparin level and CBC  Tera Mater 10/12/2017,9:22 PM

## 2017-10-12 NOTE — ED Notes (Signed)
Per CT Tech, Brenda Michael, pt had onset of nausea with single episode of emesis while getting scan. MD notified.

## 2017-10-12 NOTE — ED Notes (Signed)
Dr. Maryfrances Bunnell advised he is changing pts bed request to Med surg at this time

## 2017-10-12 NOTE — Progress Notes (Signed)
ANTICOAGULATION CONSULT NOTE - Initial Consult  Pharmacy Consult for Heparin Indication: portal vein thrombosis  No Active Allergies  Patient Measurements: Height:  (180.3 cm) Weight: 267 lb (121.1 kg) IBW/kg (Calculated) : 70.8 Heparin Dosing Weight: 100 kg  Vital Signs: Temp: 99.2 F (37.3 C) (05/23 2034) Temp Source: Oral (05/23 2034) BP: 160/103 (05/24 0500) Pulse Rate: 72 (05/24 0500)  Labs: Recent Labs    10/11/17 2041 10/11/17 2043 10/12/17 0051  HGB 13.9  --   --   HCT 41.1  --   --   PLT 215  --   --   CREATININE 0.89  --   --   TROPONINI  --  <0.03 <0.03    Estimated Creatinine Clearance: 121.8 mL/min (by C-G formula based on SCr of 0.89 mg/dL).   Medical History: Past Medical History:  Diagnosis Date  . DVT (deep venous thrombosis) (HCC)   . Hernia, hiatal   . Hypertension     Medications:  No current facility-administered medications on file prior to encounter.    Current Outpatient Medications on File Prior to Encounter  Medication Sig Dispense Refill  . BUPROPION HCL PO Take by mouth.    . meclizine (ANTIVERT) 25 MG tablet Take 1 tablet (25 mg total) by mouth 3 (three) times daily as needed for dizziness. 30 tablet 0  . ondansetron (ZOFRAN ODT) 4 MG disintegrating tablet Take 1 tablet (4 mg total) by mouth every 8 (eight) hours as needed for nausea. 6 tablet 0  . triamterene-hydrochlorothiazide (DYAZIDE) 37.5-25 MG per capsule Take 1 capsule by mouth daily.       Assessment: 42 y.o. female with L portal vein thrombus for heparin Goal of Therapy:  Heparin level 0.3-0.7 units/ml Monitor platelets by anticoagulation protocol: Yes   Plan:  Heparin 4000 units IV bolus, then start heparin 1600 units/hr Check heparin level in 6 hours.   Eddie Candle 10/12/2017,5:15 AM

## 2017-10-12 NOTE — Care Management Note (Addendum)
Case Management Note  Patient Details  Name: Brenda Michael MRN: [id number] Date of Birth: 03/09/1976  Subjective/Objective: History of HTN and HELLP syndrome with second pregnancy as well as previous DVT; Admitted for Portal vein thrombosis               Action/Plan: Prior to admission patient lived at home.  At discharge patient plans to return to same living situation.  At discharge patient has transportation home.  Patient is able to afford medications/food.  Patient has transportation to medical appointments.  NCM will continue to follow for discharge transition needs.  Expected Discharge Date:   To be determined               Expected Discharge Plan:  Home/Self Care  In-House Referral:    N/A Discharge planning Services  CM Consult  Status of Service:  In process, will continue to follow  Yancey Flemings, RN 10/12/2017, 2:16 PM

## 2017-10-13 DIAGNOSIS — E876 Hypokalemia: Secondary | ICD-10-CM

## 2017-10-13 DIAGNOSIS — I10 Essential (primary) hypertension: Secondary | ICD-10-CM

## 2017-10-13 LAB — BASIC METABOLIC PANEL
ANION GAP: 8 (ref 5–15)
CO2: 27 mmol/L (ref 22–32)
Calcium: 8.3 mg/dL — ABNORMAL LOW (ref 8.9–10.3)
Chloride: 104 mmol/L (ref 101–111)
Creatinine, Ser: 0.76 mg/dL (ref 0.44–1.00)
GFR calc Af Amer: >60 mL/min (ref >60)
Glucose, Bld: 96 mg/dL (ref 65–99)
POTASSIUM: 3.1 mmol/L — AB (ref 3.5–5.1)
Sodium: 139 mmol/L (ref 135–145)

## 2017-10-13 LAB — CBC
HCT: 36.1 % (ref 36.0–46.0)
HEMOGLOBIN: 11.5 g/dL — AB (ref 12.0–15.0)
MCH: 28.4 pg (ref 26.0–34.0)
MCHC: 31.9 g/dL (ref 30.0–36.0)
MCV: 89.1 fL (ref 78.0–100.0)
Platelets: 181 10*3/uL (ref 150–400)
RBC: 4.05 MIL/uL (ref 3.87–5.11)
RDW: 12.5 % (ref 11.5–15.5)
WBC: 7 10*3/uL (ref 4.0–10.5)

## 2017-10-13 LAB — HIV ANTIBODY (ROUTINE TESTING W REFLEX): HIV SCREEN 4TH GENERATION: NONREACTIVE

## 2017-10-13 LAB — HEPATIC FUNCTION PANEL
ALT: 15 U/L (ref 14–54)
AST: 12 U/L — ABNORMAL LOW (ref 15–41)
Albumin: 2.6 g/dL — ABNORMAL LOW (ref 3.5–5.0)
Alkaline Phosphatase: 63 U/L (ref 38–126)
BILIRUBIN INDIRECT: 0.4 mg/dL (ref 0.3–0.9)
Bilirubin, Direct: 0.1 mg/dL (ref 0.1–0.5)
TOTAL PROTEIN: 6 g/dL — AB (ref 6.5–8.1)
Total Bilirubin: 0.5 mg/dL (ref 0.3–1.2)

## 2017-10-13 LAB — URINE CULTURE

## 2017-10-13 LAB — HEPARIN LEVEL (UNFRACTIONATED): HEPARIN UNFRACTIONATED: 0.56 [IU]/mL (ref 0.30–0.70)

## 2017-10-13 MED ORDER — OXYCODONE HCL 5 MG PO TABS
5.0000 mg | ORAL_TABLET | Freq: Four times a day (QID) | ORAL | Status: DC | PRN
  Administered 2017-10-14: 5 mg via ORAL
  Filled 2017-10-13: qty 1

## 2017-10-13 MED ORDER — WARFARIN - PHARMACIST DOSING INPATIENT
Freq: Every day | Status: DC
  Administered 2017-10-14: 18:00:00

## 2017-10-13 MED ORDER — POTASSIUM CHLORIDE CRYS ER 20 MEQ PO TBCR
40.0000 meq | EXTENDED_RELEASE_TABLET | Freq: Once | ORAL | Status: AC
  Administered 2017-10-13: 40 meq via ORAL
  Filled 2017-10-13: qty 2

## 2017-10-13 MED ORDER — WARFARIN SODIUM 7.5 MG PO TABS
7.5000 mg | ORAL_TABLET | Freq: Once | ORAL | Status: AC
  Administered 2017-10-13: 7.5 mg via ORAL
  Filled 2017-10-13: qty 1

## 2017-10-13 NOTE — Progress Notes (Addendum)
ANTICOAGULATION CONSULT NOTE  Pharmacy Consult for Heparin/warfarin  Indication: portal vein thrombosis  Allergies  Allergen Reactions  . Ivp Dye [Iodinated Diagnostic Agents] Nausea And Vomiting    Patient Measurements: Height:  (180.3 cm) Weight: 265 lb 3.4 oz (120.3 kg) IBW/kg (Calculated) : 70.8 Heparin Dosing Weight: 100 kg  Vital Signs: Temp: 98.3 F (36.8 C) (05/25 0416) Temp Source: Oral (05/25 0416) BP: 124/80 (05/25 0416) Pulse Rate: 76 (05/25 0416)  Labs: Recent Labs    10/11/17 2041 10/11/17 2043 10/12/17 0051 10/12/17 1223 10/12/17 1938 10/13/17 0425  HGB 13.9  --   --   --   --  11.5*  HCT 41.1  --   --   --   --  36.1  PLT 215  --   --   --   --  181  HEPARINUNFRC  --   --   --  0.56 0.45 0.56  CREATININE 0.89  --   --   --   --  0.76  TROPONINI  --  <0.03 <0.03  --   --   --     Estimated Creatinine Clearance: 135 mL/min (by C-G formula based on SCr of 0.76 mg/dL).  Assessment: 42 y.o. female with portal vein thrombosis continues on IV heparin. Heparin level remains therapeutic on current drip. Hgb low but stable.  No bleeding noted. Pharmacy now consulted to dose warfarin. D1 warfarin/heparin bridge. Poor PO meal intake. No significant DDI noted.   Goal of Therapy:  Heparin level 0.3-0.7 units/ml Monitor platelets by anticoagulation protocol: Yes   Plan:  Continue heparin gtt 1600 units/hr Warfarin 7.5 mg PO x 1 Daily INR, heparin level and CBC Monitor clinical course, s/sx bleeding, PO intake, DDI  Donnella Bi, PharmD PGY1 Acute Care Pharmacy Resident 10/13/2017,8:07 AM

## 2017-10-13 NOTE — Progress Notes (Signed)
PROGRESS NOTE    [name]  ION:[id number] DOB: [birth year] DOA: 10/11/2017 PCP: Shellia Cleverly, PA      Brief Narrative:  Brenda Michael is a 42 y.o. 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  CT abdomen with contrast showed left portal vein thrombosis.   Assessment & Plan:  Portal vein thrombosis Patient with acute epigastric pain and vomiting.  CT shows occlusion of the left portal vein, patent main portal vein and right portal vein.  No previous history of cirrhosis, no pancreatitis, no other inciting factor.    Initial [place] images suggested a liver lesion, this was discussed with Radiology, who are clear that the follow up CT rules OUT a liver lesion, likely this was misinterpretation of the thrombosed left portal vein.    The patient has history of DVT she reports, although I see no records to confirm this (no previous LE ultrasounds here, the only ones in CareEverywhere were from Sep 2016 and they showed no acute or chornic thrombus).  Furthermore, a 2016 appointment with her PCP for leg swelling at which the bilateral LE [place] was ordered makes NO mention of a previous clot in her legs.  She does report a family history of multiple "clots" in her mother, but subsequently is unable to differentiate for me if her mother had DVT/PE or aneurysm or stroke (and her CareEverywhere chart makes no indication that anyone in the family has a history of VTE at all).  If she has no confirmed previous clot, would anticoagulate for 6 months, then stop.  -Continue heparin -Start warfarin, dosing per pharmacy -Continue hydromorphone IV 1 mg p.r.n.   q4 hrs for pain, or oxycodone -Stop OCP -Given atypical vascular bed, young age, recommend Hematology follow up for counseling about utility of, timing of hypercoagulable workup   Hypertension -Continue metoprolol  Leukocyturia Multiple  species. -Monitor off antibiotics  Hypokalemia Supplemented, still low -Repeat BMP -Continue oral K     DVT prophylaxis: N/A Code Status: FULL Family Communication: None present MDM and disposition Plan: Below labs and imaging reports were personally reviewed and summarized above.  Outside records were reviewed and summarized above.  Patient status is clinically stable.  She presents with acute abdominal pain, found to have spontaenous portal vein thrombosis.  Anticoagulation started.  Due to insurance constraints, the patient will require warfarin initiation.  We will initiate warfarin today, with heparin bridge.  Likely discharge Tuesday or Wednesday when INR range 2-3  Consultants:   None  Procedures:   None  Antimicrobials:   CTX x1    Subjective: Still has severe epigastric pain, relieved with hydromorphone, severe otherwise.  She has mild nausea.  Appetite is reduced.  No leg swelling, chest pain, dyspnea, confusion, weakness, vomiting.     Objective: Vitals:   10/12/17 1345 10/12/17 2210 10/13/17 0416 10/13/17 0935  BP: (!) 141/100 (!) 155/121 124/80 (!) 146/102  Pulse: 68 (!) 59 76 (!) 57  Resp: Temp: 98.3 F (36.8 C) 98.3 F (36.8 C) 98.3 F (36.8 C) 98.3 F (36.8 C)  TempSrc: Oral Oral Oral Oral  SpO2: 100% 99% 100% 100%  Weight:  120.3 kg (265 lb 3.4 oz)    Height:        Intake/Output Summary (Last 24 hours) at 10/13/2017 1341 Last data filed at 10/13/2017 0500 Gross per 24 hour  Intake 1724 ml  Output 25 ml  Net 1699 ml   Filed Weights   10/11/17 2026 10/12/17 2210  Weight: 121.1 kg (267 lb) 120.3 kg (265 lb 3.4 oz)    Examination:   General: Healthy, alert and in no distress.  Responds appropriately to questions.  Eye contact, dress and hygiene appropriate. HEENT: Corneas clear, conjunctivae and sclerae normal without injection or icterus, lids and lashes normal.  Visual tracking smooth.  OP moist without erythema, exudates,  cobblestoning, or ulcers.  No airway deformities.  Neck supple.   Cardiac: RRR, nl S1-S2, no murmurs, rubs, gallops.  Capillary refill is less than 2 seconds.   Respiratory: Normal respiratory rate and rhythm.  CTAB without rales or wheezes. Abdomen: BS present.   All epigastric pain with voluntary guarding.  No masses or organomegaly.  No scars.  No striae, dilated veins, rashes, or lesions.  No ascites, distension. Extremities: No deformities/injuries.  5/5 grip strength and upper extremity flexion/extension, symmetrically.  Extremities are warm and well-perfused. Neuro: Sensorium intact.  Speech is fluent.  Naming is grossly intact, and the patient's recall, recent and remote, as well as general fund of knowledge seem within normal limits.  Muscle tone normal, without fasciculations.  Moves all extremities equally and with normal coordination.  Attention span and concentration are within normal limits.  Psych: Mood " okay", blunted range of affect.  Normal rate and rhythm of speech.  Thought content appropriate, and thought process linear.  No SI/HI, aural or visual hallucinations or delusions. Attention and concentration are normal.     Data Reviewed: I have personally reviewed following labs and imaging studies:  CBC: Recent Labs  Lab 10/11/17 2041 10/13/17 0425  WBC 9.7 7.0  HGB 13.9 11.5*  HCT 41.1 36.1  MCV 87.8 89.1  PLT 215 181   Basic Metabolic Panel: Recent Labs  Lab 10/11/17 2041 10/13/17 0425  NA 137 139  K 3.3* 3.1*  CL 103 104  CO2 25 27  GLUCOSE 92 96  BUN 9 <5*  CREATININE 0.89 0.76  CALCIUM 8.6* 8.3*   GFR: Estimated Creatinine Clearance: 135 mL/min (by C-G formula based on SCr of 0.76 mg/dL). Liver Function Tests: Recent Labs  Lab 10/11/17 2041 10/13/17 0425  AST 19 12*  ALT 21 15  ALKPHOS 69 63  BILITOT 0.8 0.5  PROT 7.5 6.0*  ALBUMIN 3.6 2.6*   Recent Labs  Lab 10/11/17 2041  LIPASE 30   No results for input(s): AMMONIA in the last 168  hours. Coagulation Profile: No results for input(s): INR, PROTIME in the last 168 hours. Cardiac Enzymes: Recent Labs  Lab 10/11/17 2043 10/12/17 0051  TROPONINI <0.03 <0.03   BNP (last 3 results) No results for input(s): PROBNP in the last 8760 hours. HbA1C: No results for input(s): HGBA1C in the last 72 hours. CBG: No results for input(s): GLUCAP in the last 168 hours. Lipid Profile: No results for input(s): CHOL, HDL, LDLCALC, TRIG, CHOLHDL, LDLDIRECT in the last 72 hours. Thyroid Function Tests: No results for input(s): TSH, T4TOTAL, FREET4, T3FREE, THYROIDAB in the last 72 hours. Anemia Panel: No results for input(s): VITAMINB12, FOLATE, FERRITIN, TIBC, IRON, RETICCTPCT in the last 72 hours. Urine analysis:    Component Value Date/Time   COLORURINE RED (A) 10/11/2017 2043   APPEARANCEUR TURBID (A) 10/11/2017 2043   LABSPEC 1.020 10/11/2017 2043   PHURINE 7.0 10/11/2017 2043   GLUCOSEU NEGATIVE 10/11/2017 2043   HGBUR LARGE (A) 10/11/2017 2043   BILIRUBINUR NEGATIVE  10/11/2017 2043   KETONESUR >80 (A) 10/11/2017 2043   PROTEINUR 30 (A) 10/11/2017 2043   UROBILINOGEN 1.0 12/28/2014 2000   NITRITE NEGATIVE 10/11/2017 2043   LEUKOCYTESUR TRACE (A) 10/11/2017 2043   Sepsis Labs: (procalcitonin:4,lacticacidven:4)  ) Recent Results (from the past 240 hour(s))  Blood culture (routine x 2)     Status: None (Preliminary result)   Collection Time: 10/11/17 11:16 PM  Result Value Ref Range Status   Specimen Description   Final    BLOOD RIGHT ANTECUBITAL Performed at [facility], [address] [place], [place] [address]    Special Requests   Final    BOTTLES DRAWN AEROBIC AND ANAEROBIC Blood Culture adequate volume Performed at [facility], [address], [place], [place] [address]    Culture   Final    NO GROWTH 1 DAY Performed at [facility], [address], [place], [place] [address]    Report Status PENDING  Incomplete    Blood culture (routine x 2)     Status: None (Preliminary result)   Collection Time: 10/11/17 11:49 PM  Result Value Ref Range Status   Specimen Description   Final    BLOOD LEFT HAND Performed at [facility], [address] [place], [place] [address]    Special Requests   Final    NONE Performed at [facility], [address] [place], [place] [address]    Culture   Final    NO GROWTH 1 DAY Performed at [facility], [address], [place], [place] [address]    Report Status PENDING  Incomplete  Culture, Urine     Status: Abnormal   Collection Time: 10/12/17  6:04 AM  Result Value Ref Range Status   Specimen Description URINE, RANDOM  Final   Special Requests   Final    NONE Performed at [facility], [address] [place], [place] [address]    Culture MULTIPLE SPECIES PRESENT, SUGGEST RECOLLECTION (A)  Final   Report Status 10/13/2017 FINAL  Final         Radiology Studies: Ct Angio Chest Pe W And/or Wo Contrast  Result Date: 10/12/2017 CLINICAL DATA:  Epigastric pain. Elevated D-dimer. PE suspected, intermediate prob, positive D-dimer EXAM: CT ANGIOGRAPHY CHEST WITH CONTRAST TECHNIQUE: Multidetector CT imaging of the chest was performed using the standard protocol during bolus administration of intravenous contrast. Multiplanar CT image reconstructions and MIPs were obtained to evaluate the vascular anatomy. CONTRAST:  ISOVUE-370 IOPAMIDOL (ISOVUE-370) INJECTION 76% COMPARISON:  Chest radiograph yesterday. FINDINGS: Cardiovascular: There are no filling defects within the pulmonary arteries to the segmental level to suggest pulmonary embolus. Cannot assess subsegmental branches due to contrast bolus timing and soft tissue attenuation from habitus. Heart size normal. Minimal pericardial fluid anteriorly. Thoracic aorta is normal in caliber without dissection. Mediastinum/Nodes: No enlarged mediastinal or hilar lymph nodes. Esophagus  is decompressed. No thyroid nodule. Lungs/Pleura: Clear lungs. No consolidation. No pleural fluid. No pulmonary edema. No pulmonary mass or suspicious nodule. Upper Abdomen: Assessed in greater detail on concurrent abdominal CT. Musculoskeletal: Minimal scoliotic curvature of the thoracic spine. There are no acute or suspicious osseous abnormalities. Review of the MIP images confirms the above findings. IMPRESSION: No pulmonary embolus or acute intrathoracic abnormality. Electronically Signed   By: Rubye Oaks M.D.   On: 10/12/2017 05:01   Ct Abdomen Pelvis W Contrast  Result Date: 10/12/2017 CLINICAL  DATA:  Epigastric abdominal pain and belching. EXAM: CT ABDOMEN AND PELVIS WITH CONTRAST TECHNIQUE: Multidetector CT imaging of the abdomen and pelvis was performed using the standard protocol following bolus administration of intravenous contrast. CONTRAST:  ISOVUE-370 IOPAMIDOL (ISOVUE-370) INJECTION 76% COMPARISON:  Right upper quadrant ultrasound yesterday. FINDINGS: Lower chest: Lung bases are clear, better assessed on concurrent chest CT. Hepatobiliary: Complete thrombosis of the left portal vein, with filling defect extending to the confluence of the right and left portal vein. No focal liver lesion, particularly corresponding to the echogenic focus on ultrasound. Gallbladder physiologically distended, no calcified stone. No biliary dilatation. No pericholecystic fluid. Pancreas: No ductal dilatation or inflammation. Spleen: Normal in size without focal abnormality. Adrenals/Urinary Tract: Normal adrenal glands. No hydronephrosis or perinephric edema. Homogeneous renal enhancement. Urinary bladder is physiologically distended without wall thickening. Stomach/Bowel: Stomach physiologically distended. No gastric wall thickening. No small bowel wall thickening, inflammatory change or obstruction. Prior appendectomy. Scattered colonic diverticulosis without diverticulitis. Vascular/Lymphatic: Left  portal vein and branches are completely occluded. Thrombus extends to the junction of the left and right portal vein, no extension to the main or extrahepatic portal vein. Splenic and mesenteric veins are patent. Minimal atherosclerosis of iliac vessels. No aneurysm. No enlarged abdominal or pelvic lymph nodes. Reproductive: The uterine fundus slightly bulbous, query fibroid. Left ovary is normal in size. Right ovary is not well visualized by CT. Other: No free air or ascites. No intra-abdominal abscess. Minimal fat in the left inguinal canal. Musculoskeletal: There are no acute or suspicious osseous abnormalities. IMPRESSION: 1. Complete occlusion of the left portal vein. Thrombus extends to the junction of the right and left intrahepatic portal venous system. 2. Slightly bulbous appearance of the uterus which can be seen with fibroids. These results were called by telephone at the time of interpretation on 10/12/2017 at 5:10 am to Dr. Azalia Bilis , who verbally acknowledged these results. Electronically Signed   By: Rubye Oaks M.D.   On: 10/12/2017 05:11   US Abdomen Limited Ruq  Result Date: 10/11/2017 CLINICAL DATA:  Right upper quadrant abdominal pain today. EXAM: ULTRASOUND ABDOMEN LIMITED RIGHT UPPER QUADRANT COMPARISON:  Right upper quadrant ultrasound 10/02/2015, 06/24/2015 FINDINGS: Gallbladder: Physiologically distended. No gallstones or wall thickening visualized. Equivocal sliver pericholecystic fluid on a single image. No sonographic Murphy sign noted by sonographer. Common bile duct: Diameter: 5 mm, normal. Liver: Within the left lobe is an echogenic 2.3 x 1.5 x 1.6 cm lesion. Additional smaller echogenic foci in the left hepatic lobe, which is slightly heterogeneous. Portal vein is patent on color Doppler imaging with normal direction of blood flow towards the liver. IMPRESSION: 1. No gallstones or gallbladder wall thickening. Equivocal sliver pericholecystic fluid on a single image is  nonspecific, and may simply be artifact. 2. Echogenic 2.3 cm focus in the left lobe of the liver, which may represent a hemangioma, however this was not seen on prior ultrasounds. Additionally left lobe is slightly heterogeneous with scattered echogenic areas. Recommend further evaluation with hepatic protocol MRI if patient is able to tolerate breath hold technique. Electronically Signed   By: Rubye Oaks M.D.   On: 10/11/2017 23:43        Scheduled Meds: . metoprolol succinate  25 mg Oral Daily  . ondansetron (ZOFRAN) IV  4 mg Intravenous Once  . warfarin  7.5 mg Oral ONCE-1800  . Warfarin - Pharmacist Dosing Inpatient   Does not apply q1800   Continuous Infusions: . heparin 1,600 Units/hr (10/12/17 8119)  LOS: 1 day    Time spent: 25 minutes    Alberteen Sam, MD [facility] 10/13/2017, 10:30 AM     Pager [phone] --- please page though AMION:  [email or web address] Password [email or web address] If 7PM-7AM, please contact night-coverage

## 2017-10-14 LAB — CBC
HCT: 37.2 % (ref 36.0–46.0)
Hemoglobin: 11.9 g/dL — ABNORMAL LOW (ref 12.0–15.0)
MCH: 28.4 pg (ref 26.0–34.0)
MCHC: 32 g/dL (ref 30.0–36.0)
MCV: 88.8 fL (ref 78.0–100.0)
Platelets: 204 10*3/uL (ref 150–400)
RBC: 4.19 MIL/uL (ref 3.87–5.11)
RDW: 12.4 % (ref 11.5–15.5)
WBC: 5.5 10*3/uL (ref 4.0–10.5)

## 2017-10-14 LAB — PROTIME-INR
INR: 1.03
PROTHROMBIN TIME: 13.4 s (ref 11.4–15.2)

## 2017-10-14 LAB — HEPARIN LEVEL (UNFRACTIONATED): HEPARIN UNFRACTIONATED: 0.63 [IU]/mL (ref 0.30–0.70)

## 2017-10-14 MED ORDER — POLYETHYLENE GLYCOL 3350 17 G PO PACK
17.0000 g | PACK | Freq: Every day | ORAL | Status: DC
  Administered 2017-10-14: 17 g via ORAL
  Filled 2017-10-14 (×2): qty 1

## 2017-10-14 MED ORDER — NIFEDIPINE ER OSMOTIC RELEASE 60 MG PO TB24
60.0000 mg | ORAL_TABLET | Freq: Every day | ORAL | Status: DC
  Administered 2017-10-14 – 2017-10-15 (×2): 60 mg via ORAL
  Filled 2017-10-14 (×2): qty 1

## 2017-10-14 MED ORDER — SENNOSIDES-DOCUSATE SODIUM 8.6-50 MG PO TABS
1.0000 | ORAL_TABLET | Freq: Two times a day (BID) | ORAL | Status: DC
  Administered 2017-10-14 – 2017-10-15 (×3): 1 via ORAL
  Filled 2017-10-14 (×3): qty 1

## 2017-10-14 MED ORDER — WARFARIN SODIUM 7.5 MG PO TABS
7.5000 mg | ORAL_TABLET | Freq: Once | ORAL | Status: DC
Start: 2017-10-14 — End: 2017-10-15
  Filled 2017-10-14: qty 1

## 2017-10-14 NOTE — Care Management (Signed)
Discussed Eliquis with patient.  Pt given Eliquis cards for first 30 days free and copay card to try to reduce copay.  Pt would like to work on trying to reduce copay during the month she gets the medication free.  If she is unable to pay for medication beyond the first month, she will communicate that to PMD and change to warfarin.  Pt reluctant to use Warfarin due to required maintenance with labs and the time that will require away from her work.

## 2017-10-14 NOTE — Progress Notes (Signed)
PROGRESS NOTE    Brenda Michael  ZOX:[id number] DOB: August 28, 1975 DOA: 10/11/2017 PCP: Brenda Cleverly, PA      Brief Narrative:  Mrs. Brenda Michael is a 42 y.o. 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  CT abdomen with contrast showed left portal vein thrombosis.   Assessment & Plan:  Portal vein thrombosis Patient with acute epigastric pain and vomiting.  CT shows occlusion of the left portal vein, patent main portal vein and right portal vein.  No previous history of cirrhosis, no pancreatitis, no other inciting factor.    Initial [place] images suggested a liver lesion, this was discussed with Radiology, who are clear that the follow up CT rules OUT a liver lesion, likely this was misinterpretation of the thrombosed left portal vein.    The patient has history of DVT she reports, although I see no records to confirm this (no previous LE ultrasounds here, the only ones in [facility] were from Sep 2016 and they showed no acute or chornic thrombus).  Furthermore, a 2016 appointment with her PCP for leg swelling at which the bilateral LE [place] was ordered makes NO mention of a previous clot in her legs.  She does report a family history of multiple "clots" in her mother, but subsequently is unable to differentiate for me if her mother had DVT/PE or aneurysm or stroke (and her [facility] chart makes no indication that anyone in the family has a history of VTE at all).  If she has no confirmed previous clot, would anticoagulate for 6 months, then stop.  -Continue heparin drip -Continue warfarin, dosing per pharmacy -Continue hydromorphone IV 1 mg as needed q4 hrs for pain -Start MiraLAX, Senokot as -Stop oral contraceptive -Given atypical vascular bed, young age, recommend Hematology follow up for counseling about utility of, timing of hypercoagulable workup   Hypertension Hypertensive  here. -Continue metoprolol -Restart home nifedipine XL    Leukocyturia Multiple species. -Monitor off antibiotics  Hypokalemia -Repeat BMP tomorrow      DVT prophylaxis: N/A opn heparin Code Status: FULL Family Communication: None present MDM and disposition Plan: Below labs and imaging reports were personally reviewed and summarized above.  Patient is clinically stable.  Medicaiton adjustments as above.  She is presents with acute abdominal pain, found to have spontaneous portal vein thrombosis.  Anticoagulation started, we are continuing warfarin, we are coordinating outpatient anticoagulation.  Likely discharge on Tuesday or Wednesday when her INR is the range 2-3.       Consultants:   None  Procedures:   None  Antimicrobials:   CTX x1    Subjective: Pain in her abdomen is somewhat improved, relieved with her hydromorphone.  She has nausea and reduced appetite.  No leg swelling, chest pain, dyspnea.  She is constipated.     Objective: Vitals:   10/13/17 1659 10/13/17 2058 10/14/17 0513 10/14/17 0946  BP: (!) 147/99 (!) 160/108 (!) 152/102 (!) 174/108  Pulse: (!) 58 (!) 51 (!) 55 (!) 56  Resp: Temp: 98.5 F (36.9 C) 98.3 F (36.8 C) 98.5 F (36.9 C) 98.4 F (36.9 C)  TempSrc: Oral Oral Oral Oral  SpO2: 100% 98% 100% 100%  Weight:  120.3 kg (265 lb 3.4 oz)    Height:        Intake/Output Summary (Last 24 hours) at 10/14/2017 1028 Last data filed at  10/14/2017 0700 Gross per 24 hour  Intake 912 ml  Output 0 ml  Net 912 ml   Filed Weights   10/11/17 2026 10/12/17 2210 10/13/17 2058  Weight: 121.1 kg (267 lb) 120.3 kg (265 lb 3.4 oz) 120.3 kg (265 lb 3.4 oz)    Examination:   General: Obese female, lying in bed, no acute distress.  Responding verbally to questions.  Eye contact and hygiene appropriate. HEENT: Anicteric, conjunctival lids and lashes normal, visual tracking Smith, OP moist without erythema, exudates.  Lips and teeth  normal.  Normal.   Cardiac: Regular rate and rhythm, no murmurs.  No lower extremity edema. Respiratory: As per Brenda Michael rate normal, no increased respiratory effort.  Lungs clear without rales or wheezes. Abdomen: Abdomen soft, she has some mild nonfocal tenderness, mostly in her epigastrium, mild tenderness in the left lower quadrant, no masses, no hepatospleno megaly.  No guarding.  No rigidity or rebound. Extremities: No deformities/injuries.  5/5 grip strength and upper extremity flexion/extension, symmetrically.  Extremities are warm and well-perfused. Neuro: Sensorium intact and responding to questions.  Speech fluent.  Cranial nerves grossly normal.  Strength equal in upper and lower extremities bilaterally. Psych: Affect blunted, normal rate and rhythm of speech, thought content appropriate, thought process linear.  Attention and concentration normal.     Data Reviewed: I have personally reviewed following labs and imaging studies:  CBC: Recent Labs  Lab 10/11/17 2041 10/13/17 0425 10/14/17 0549  WBC 9.7 7.0 5.5  HGB 13.9 11.5* 11.9*  HCT 41.1 36.1 37.2  MCV 87.8 89.1 88.8  PLT 215 181 204   Basic Metabolic Panel: Recent Labs  Lab 10/11/17 2041 10/13/17 0425  NA 137 139  K 3.3* 3.1*  CL 103 104  CO2 25 27  GLUCOSE 92 96  BUN 9 <5*  CREATININE 0.89 0.76  CALCIUM 8.6* 8.3*   GFR: Estimated Creatinine Clearance: 135 mL/min (by C-G formula based on SCr of 0.76 mg/dL). Liver Function Tests: Recent Labs  Lab 10/11/17 2041 10/13/17 0425  AST 19 12*  ALT 21 15  ALKPHOS 69 63  BILITOT 0.8 0.5  PROT 7.5 6.0*  ALBUMIN 3.6 2.6*   Recent Labs  Lab 10/11/17 2041  LIPASE 30   No results for input(s): AMMONIA in the last 168 hours. Coagulation Profile: Recent Labs  Lab 10/14/17 0549  INR 1.03   Cardiac Enzymes: Recent Labs  Lab 10/11/17 2043 10/12/17 0051  TROPONINI <0.03 <0.03   BNP (last 3 results) No results for input(s): PROBNP in the last 8760  hours. HbA1C: No results for input(s): HGBA1C in the last 72 hours. CBG: No results for input(s): GLUCAP in the last 168 hours. Lipid Profile: No results for input(s): CHOL, HDL, LDLCALC, TRIG, CHOLHDL, LDLDIRECT in the last 72 hours. Thyroid Function Tests: No results for input(s): TSH, T4TOTAL, FREET4, T3FREE, THYROIDAB in the last 72 hours. Anemia Panel: No results for input(s): VITAMINB12, FOLATE, FERRITIN, TIBC, IRON, RETICCTPCT in the last 72 hours. Urine analysis:    Component Value Date/Time   COLORURINE RED (A) 10/11/2017 2043   APPEARANCEUR TURBID (A) 10/11/2017 2043   LABSPEC 1.020 10/11/2017 2043   PHURINE 7.0 10/11/2017 2043   GLUCOSEU NEGATIVE 10/11/2017 2043   HGBUR LARGE (A) 10/11/2017 2043   BILIRUBINUR NEGATIVE 10/11/2017 2043   KETONESUR >80 (A) 10/11/2017 2043   PROTEINUR 30 (A) 10/11/2017 2043   UROBILINOGEN 1.0 12/28/2014 2000   NITRITE NEGATIVE 10/11/2017 2043   LEUKOCYTESUR TRACE (A) 10/11/2017 2043  Sepsis Labs: (procalcitonin:4,lacticacidven:4)  ) Recent Results (from the past 240 hour(s))  Blood culture (routine x 2)     Status: None (Preliminary result)   Collection Time: 10/11/17 11:16 PM  Result Value Ref Range Status   Specimen Description   Final    BLOOD RIGHT ANTECUBITAL Performed at [facility], [address] [place], [place] [address]    Special Requests   Final    BOTTLES DRAWN AEROBIC AND ANAEROBIC Blood Culture adequate volume Performed at [facility], [address] [place], [place] [address]    Culture   Final    NO GROWTH 2 DAYS Performed at [facility], [address], [place], [place] [address]    Report Status PENDING  Incomplete  Blood culture (routine x 2)     Status: None (Preliminary result)   Collection Time: 10/11/17 11:49 PM  Result Value Ref Range Status   Specimen Description   Final    BLOOD LEFT HAND Performed at [facility], [address] [place], [place] [address]    Special Requests   Final    NONE Performed at [facility], [address] [place], [place] [address]    Culture   Final    NO GROWTH 2 DAYS Performed at [facility], [address] [place], [place] [address]    Report Status PENDING  Incomplete  Culture, Urine     Status: Abnormal   Collection Time: 10/12/17  6:04 AM  Result Value Ref Range Status   Specimen Description URINE, RANDOM  Final   Special Requests   Final    NONE Performed at [facility], [address] [place], [place] [address]    Culture MULTIPLE SPECIES PRESENT, SUGGEST RECOLLECTION (A)  Final   Report Status 10/13/2017 FINAL  Final         Radiology Studies: No results found.      Scheduled Meds: . metoprolol succinate  25 mg Oral Daily  . NIFEdipine  60 mg Oral Daily  . ondansetron (ZOFRAN) IV  4 mg Intravenous Once  . polyethylene glycol  17 g Oral Daily  . senna-docusate  1 tablet Oral BID  . warfarin  7.5 mg Oral ONCE-1800  . Warfarin - Pharmacist Dosing Inpatient   Does not apply q1800   Continuous Infusions: . heparin 1,500 Units/hr (10/14/17 0946)     LOS: 2 days    Time spent: 25 minutes    Alberteen Sam, MD [facility] 10/14/2017, 10:00 AM    Pager [phone] --- please page though AMION:  [email or web address] If 7PM-7AM, please contact night-coverage

## 2017-10-14 NOTE — Progress Notes (Signed)
Coumadin held, patient has considered her options and feels with 100% certainty she want to leave on eliquis

## 2017-10-14 NOTE — Progress Notes (Signed)
BP continues to be elevated 174/108, metoprolol given, MD paged, will continue to monitor

## 2017-10-14 NOTE — Progress Notes (Signed)
ANTICOAGULATION CONSULT NOTE  Pharmacy Consult for Heparin/warfarin  Indication: portal vein thrombosis  Allergies  Allergen Reactions  . Ivp Dye [Iodinated Diagnostic Agents] Nausea And Vomiting    Patient Measurements: Height:  (180.3 cm) Weight: 265 lb 3.4 oz (120.3 kg) IBW/kg (Calculated) : 70.8 Heparin Dosing Weight: 100 kg  Vital Signs: Temp: 98.5 F (36.9 C) (05/26 0513) Temp Source: Oral (05/26 0513) BP: 152/102 (05/26 0513) Pulse Rate: 55 (05/26 0513)  Labs: Recent Labs    10/11/17 2041 10/11/17 2043 10/12/17 0051  10/12/17 1938 10/13/17 0425 10/14/17 0549  HGB 13.9  --   --   --   --  11.5* 11.9*  HCT 41.1  --   --   --   --  36.1 37.2  PLT 215  --   --   --   --  181 204  LABPROT  --   --   --   --   --   --  13.4  INR  --   --   --   --   --   --  1.03  HEPARINUNFRC  --   --   --    < > 0.45 0.56 0.63  CREATININE 0.89  --   --   --   --  0.76  --   TROPONINI  --  <0.03 <0.03  --   --   --   --    < > = values in this interval not displayed.    Estimated Creatinine Clearance: 135 mL/min (by C-G formula based on SCr of 0.76 mg/dL).  Assessment: 42 y.o. female with portal vein thrombosis continues on IV heparin. Heparin level remains therapeutic on current drip. Hgb low but stable. Scr trending down and wnl. No bleeding noted. Pharmacy now consulted to dose warfarin. D2 warfarin/heparin bridge. Poor PO meal intake. No significant DDI noted. INR subtherapeutic, 1.03.  Goal of Therapy:  Heparin level 0.3-0.7 units/ml Monitor platelets by anticoagulation protocol: Yes   Plan:  Decrease heparin gtt to 1500 units/hr Warfarin 7.5 mg PO x 1 Daily INR, heparin level and CBC Monitor clinical course, s/sx bleeding, PO intake, DDI  Donnella Bi, PharmD PGY1 Acute Care Pharmacy Resident 10/14/2017,8:48 AM

## 2017-10-15 LAB — CBC
HEMATOCRIT: 42.4 % (ref 36.0–46.0)
HEMOGLOBIN: 13.7 g/dL (ref 12.0–15.0)
MCH: 28.3 pg (ref 26.0–34.0)
MCHC: 32.3 g/dL (ref 30.0–36.0)
MCV: 87.6 fL (ref 78.0–100.0)
Platelets: 241 10*3/uL (ref 150–400)
RBC: 4.84 MIL/uL (ref 3.87–5.11)
RDW: 12.2 % (ref 11.5–15.5)
WBC: 6.2 10*3/uL (ref 4.0–10.5)

## 2017-10-15 LAB — BASIC METABOLIC PANEL
ANION GAP: 11 (ref 5–15)
BUN: <5 mg/dL — ABNORMAL LOW (ref 6–20)
CO2: 25 mmol/L (ref 22–32)
Calcium: 9.1 mg/dL (ref 8.9–10.3)
Chloride: 102 mmol/L (ref 101–111)
Creatinine, Ser: 0.79 mg/dL (ref 0.44–1.00)
GFR calc Af Amer: >60 mL/min (ref >60)
GLUCOSE: 97 mg/dL (ref 65–99)
POTASSIUM: 3.7 mmol/L (ref 3.5–5.1)
Sodium: 138 mmol/L (ref 135–145)

## 2017-10-15 LAB — PROTIME-INR
INR: 1.06
Prothrombin Time: 13.7 seconds (ref 11.4–15.2)

## 2017-10-15 LAB — HEPARIN LEVEL (UNFRACTIONATED): Heparin Unfractionated: 0.56 IU/mL (ref 0.30–0.70)

## 2017-10-15 MED ORDER — ONDANSETRON HCL 4 MG PO TABS: 4.0000 mg | ORAL_TABLET | Freq: Every day | ORAL | 1 refills | Status: DC | PRN

## 2017-10-15 MED ORDER — APIXABAN 5 MG PO TABS: ORAL_TABLET | ORAL | 0 refills | Status: DC

## 2017-10-15 MED ORDER — APIXABAN 5 MG PO TABS: ORAL_TABLET | ORAL | 0 refills | Status: AC

## 2017-10-15 MED ORDER — OXYCODONE HCL 5 MG PO TABS: 5.0000 mg | ORAL_TABLET | Freq: Four times a day (QID) | ORAL | 0 refills | Status: DC | PRN

## 2017-10-15 MED ORDER — APIXABAN 5 MG PO TABS
10.0000 mg | ORAL_TABLET | Freq: Two times a day (BID) | ORAL | Status: DC
  Administered 2017-10-15: 10 mg via ORAL
  Filled 2017-10-15: qty 2

## 2017-10-15 MED ORDER — NIFEDIPINE ER 60 MG PO TB24: 60.0000 mg | ORAL_TABLET | Freq: Every day | ORAL | 0 refills | Status: AC

## 2017-10-15 MED ORDER — APIXABAN 5 MG PO TABS: 5.0000 mg | ORAL_TABLET | Freq: Two times a day (BID) | ORAL | Status: DC

## 2017-10-15 NOTE — Discharge Summary (Signed)
Physician Discharge Summary  Brenda Michael WJX:[id number] DOB: Aug 18, 1975 DOA: 10/11/2017  PCP: Shellia Cleverly, PA  Admit date: 10/11/2017 Discharge date: 10/15/2017  Admitted From: Home  Disposition:  Home   Recommendations for Outpatient Follow-up:  1. Follow up with PCP in 1 week 2. Please assist patient in referral to Hematology, if needed regarding thrombophilic workup, timing, and duration of anticoagulation 3. Check BP and titrate nifedipine as needed 4. Please confirm whether patient has or has not had a previous DVT   Home Health: None  Equipment/Devices: None  Discharge Condition: Good  CODE STATUS: FULL Diet recommendation: Regular  Brief/Interim Summary: Brenda Michael is a 42 y.o. 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  CT abdomen with contrast showed left portal vein thrombosis.             Portal vein thrombosis -Patient presented with acute epigastric pain and vomiting.   -CT showed occlusion of the left portal vein, patent main portal vein and right portal vein.  No previous history of cirrhosis, no pancreatitis, no other inciting factor.   -The patient **reported** a history of DVT, although I see only records that refute this (no previous LE ultrasounds here, the only ones in [facility] were from Sep 2016 and they showed NO acute or chronic thrombus).  Furthermore, a 2016 appointment with her PCP for leg swelling at which the bilateral LE [place] was ordered makes NO mention of a previous clot in her legs.  She does report a family history of multiple "clots" in her mother, but subsequently is unable to differentiate for me if her mother had DVT/PE or aneurysm or stroke (and her [facility] chart makes no indication that anyone in the family has a history of VTE at all). -If she has no confirmed previous clot, would anticoagulate for 6 months, then  stop.   -If she has had confirmed previous DVT or PE, she should anticoagulate indefinitely.  -In light of her age and the history of pre-eclampsia/HELLP, I recommend Hematology referral for recommendations regarding whether to test for APS or other thrombophilias, and if so, when. -Patient was treated with heparin, transitioned to Eliquis after pharmacy benefits were clarified.  -Case was discussed with Gastroenterology by phone, who felt in the abscence of provoking factor, she should have hematology referral for thrombophilia, no other GI work up or follow up necessary.  -Pain was difficult to manage, ultimately improved with opiate pain medciation and acetaminophen.   -Her oral OCP was stopped.    Hypertension Hypertensive here.  Nifedipine restarted.    Leukocyturia Multiple species on urine sample.  No symptoms of UTI.   Hypokalemia Resolved.     Discharge Diagnoses:  Principal Problem:   Portal vein thrombosis Active Problems:   Hypertension    Discharge Instructions  Discharge Instructions    Ambulatory referral to Hematology   Complete by:  As directed    For evaluation for hypercoagulable workup   Diet general   Complete by:  As directed    Discharge instructions   Complete by:  As directed    From Dr. Maryfrances Bunnell: You were admitted with a blood clot in your portal vein (also called a "portal vein thrombosis").  Thankfully, this clot was only in one of the branches of the portal vein and the main portal vein is fine.  The treatment for this is with a  blood thinner for 6 months.  For the next week (until Sunday Jun 2) take apixaban/Eliquis 10 mg (2 tabs) twice daily, then Take apixaban/Eliquis 5 mg twice daily  You will need to take this for 6 months without stopping. If you see black and tarry bowel movements, or notice bright red blood in your bowel movements, call your doctor immediately or go to the ER.  Take oxycodone 5 mg as needed for pain.  Avoid  driving while taking this medicine, keep it secured. Dispose of unused medicine.  Oxycodone also works better with acetaminophen.  Use ibuprofen or any NSAID pain relieve sparingly while taking a blood thinner (NSAIDs provoke stomach bleeding, which can be severe with blood thinners). Follow up with Brenda Michael in the next week.    It is unusual to have a clot in the portal vein in persons without liver disease or pancreas disease (and we checked you for both).  For that reason, I recommend you see a hematologist to discuss genetic testing for diseases associated with blood clots.  Discuss with them if any testing is needed, and if so, when. I have made this referral to the [facility], but if you haven't heard from them in 1 week, ask Brenda Michael to help with the referral.  Your blood pressure was high while you were here, so take nifedipine again and have Brenda Michael check your pressure when you see her.   Increase activity slowly   Complete by:  As directed      Allergies as of 10/15/2017      Reactions   Ivp Dye [iodinated Diagnostic Agents] Nausea And Vomiting      Medication List    STOP taking these medications   traMADol 50 MG tablet Commonly known as:  ULTRAM     TAKE these medications   apixaban 5 MG Tabs tablet Commonly known as:  ELIQUIS Take 10 mg (2 tabs) twice daily for 7 days, then take 5 mg (1 tab) twice daily   meclizine 25 MG tablet Commonly known as:  ANTIVERT Take 1 tablet (25 mg total) by mouth 3 (three) times daily as needed for dizziness.   metoprolol succinate 25 MG 24 hr tablet Commonly known as:  TOPROL-XL Take 25 mg by mouth daily.   NIFEdipine 60 MG 24 hr tablet Commonly known as:  PROCARDIA-XL/ADALAT CC Take 1 tablet (60 mg total) by mouth daily.   ondansetron 4 MG disintegrating tablet Commonly known as:  ZOFRAN ODT Take 1 tablet (4 mg total) by mouth every 8 (eight) hours as needed for nausea.   oxyCODONE 5 MG immediate  release tablet Commonly known as:  Oxy IR/ROXICODONE Take 1 tablet (5 mg total) by mouth every 6 (six) hours as needed for moderate pain.   traZODone 50 MG tablet Commonly known as:  DESYREL Take 50 mg by mouth at bedtime as needed for sleep.      Follow-up Information    Shellia Cleverly, [place].   Specialty:  General Practice Contact information: [address] [place] [address] [phone]          Allergies  Allergen Reactions  . Ivp Dye [Iodinated Diagnostic Agents] Nausea And Vomiting    Consultations:  None   Procedures/Studies: Ct Angio Chest Pe W And/or Wo Contrast  Result Date: 10/12/2017 CLINICAL DATA:  Epigastric pain. Elevated D-dimer. PE suspected, intermediate prob, positive D-dimer EXAM: CT ANGIOGRAPHY CHEST WITH CONTRAST TECHNIQUE: Multidetector CT imaging of the chest was performed  using the standard protocol during bolus administration of intravenous contrast. Multiplanar CT image reconstructions and MIPs were obtained to evaluate the vascular anatomy. CONTRAST:  ISOVUE-370 IOPAMIDOL (ISOVUE-370) INJECTION 76% COMPARISON:  Chest radiograph yesterday. FINDINGS: Cardiovascular: There are no filling defects within the pulmonary arteries to the segmental level to suggest pulmonary embolus. Cannot assess subsegmental branches due to contrast bolus timing and soft tissue attenuation from habitus. Heart size normal. Minimal pericardial fluid anteriorly. Thoracic aorta is normal in caliber without dissection. Mediastinum/Nodes: No enlarged mediastinal or hilar lymph nodes. Esophagus is decompressed. No thyroid nodule. Lungs/Pleura: Clear lungs. No consolidation. No pleural fluid. No pulmonary edema. No pulmonary mass or suspicious nodule. Upper Abdomen: Assessed in greater detail on concurrent abdominal CT. Musculoskeletal: Minimal scoliotic curvature of the thoracic spine. There are no acute or suspicious osseous abnormalities. Review of the MIP images confirms the  above findings. IMPRESSION: No pulmonary embolus or acute intrathoracic abnormality. Electronically Signed   By: Rubye Oaks M.D.   On: 10/12/2017 05:01   Ct Abdomen Pelvis W Contrast  Result Date: 10/12/2017 CLINICAL DATA:  Epigastric abdominal pain and belching. EXAM: CT ABDOMEN AND PELVIS WITH CONTRAST TECHNIQUE: Multidetector CT imaging of the abdomen and pelvis was performed using the standard protocol following bolus administration of intravenous contrast. CONTRAST:  ISOVUE-370 IOPAMIDOL (ISOVUE-370) INJECTION 76% COMPARISON:  Right upper quadrant ultrasound yesterday. FINDINGS: Lower chest: Lung bases are clear, better assessed on concurrent chest CT. Hepatobiliary: Complete thrombosis of the left portal vein, with filling defect extending to the confluence of the right and left portal vein. No focal liver lesion, particularly corresponding to the echogenic focus on ultrasound. Gallbladder physiologically distended, no calcified stone. No biliary dilatation. No pericholecystic fluid. Pancreas: No ductal dilatation or inflammation. Spleen: Normal in size without focal abnormality. Adrenals/Urinary Tract: Normal adrenal glands. No hydronephrosis or perinephric edema. Homogeneous renal enhancement. Urinary bladder is physiologically distended without wall thickening. Stomach/Bowel: Stomach physiologically distended. No gastric wall thickening. No small bowel wall thickening, inflammatory change or obstruction. Prior appendectomy. Scattered colonic diverticulosis without diverticulitis. Vascular/Lymphatic: Left portal vein and branches are completely occluded. Thrombus extends to the junction of the left and right portal vein, no extension to the main or extrahepatic portal vein. Splenic and mesenteric veins are patent. Minimal atherosclerosis of iliac vessels. No aneurysm. No enlarged abdominal or pelvic lymph nodes. Reproductive: The uterine fundus slightly bulbous, query fibroid. Left ovary is  normal in size. Right ovary is not well visualized by CT. Other: No free air or ascites. No intra-abdominal abscess. Minimal fat in the left inguinal canal. Musculoskeletal: There are no acute or suspicious osseous abnormalities. IMPRESSION: 1. Complete occlusion of the left portal vein. Thrombus extends to the junction of the right and left intrahepatic portal venous system. 2. Slightly bulbous appearance of the uterus which can be seen with fibroids. These results were called by telephone at the time of interpretation on 10/12/2017 at 5:10 am to Dr. Azalia Bilis , who verbally acknowledged these results. Electronically Signed   By: Rubye Oaks M.D.   On: 10/12/2017 05:11   US Abdomen Limited Ruq  Result Date: 10/11/2017 CLINICAL DATA:  Right upper quadrant abdominal pain today. EXAM: ULTRASOUND ABDOMEN LIMITED RIGHT UPPER QUADRANT COMPARISON:  Right upper quadrant ultrasound 10/02/2015, 06/24/2015 FINDINGS: Gallbladder: Physiologically distended. No gallstones or wall thickening visualized. Equivocal sliver pericholecystic fluid on a single image. No sonographic Murphy sign noted by sonographer. Common bile duct: Diameter: 5 mm, normal. Liver: Within the left lobe is an  echogenic 2.3 x 1.5 x 1.6 cm lesion. Additional smaller echogenic foci in the left hepatic lobe, which is slightly heterogeneous. Portal vein is patent on color Doppler imaging with normal direction of blood flow towards the liver. IMPRESSION: 1. No gallstones or gallbladder wall thickening. Equivocal sliver pericholecystic fluid on a single image is nonspecific, and may simply be artifact. 2. Echogenic 2.3 cm focus in the left lobe of the liver, which may represent a hemangioma, however this was not seen on prior ultrasounds. Additionally left lobe is slightly heterogeneous with scattered echogenic areas. Recommend further evaluation with hepatic protocol MRI if patient is able to tolerate breath hold technique. Electronically Signed   By:  Rubye Oaks M.D.   On: 10/11/2017 23:43       Subjective: Still some mild epigastric pain.  Stil some nausea, but no vomiting.  Appetite okay.  No confusion, weakness, leg swelling, chest pain, hematochezia, melena.  Discharge Exam: Vitals:   10/14/17 2056 10/15/17 0857  BP: (!) 134/91 (!) 143/94  Pulse: 81 77  Resp: 16 16  Temp: 98.4 F (36.9 C) 98.6 F (37 C)  SpO2: 100% 100%   Vitals:   10/14/17 1158 10/14/17 1756 10/14/17 2056 10/15/17 0857  BP: (!) 149/99 (!) 147/98 (!) 134/91 (!) 143/94  Pulse: (!) 57 70 81 77  Resp:  Temp:  98 F (36.7 C) 98.4 F (36.9 C) 98.6 F (37 C)  TempSrc:  Oral Oral Oral  SpO2:  100% 100% 100%  Weight:      Height:        General: Pt is alert, awake, not in acute distress, lying in bed, somewhat sleepy Cardiovascular: RRR, S1/S2 +, no rubs, no gallops Respiratory: CTA bilaterally, no wheezing, no rhonchi Abdominal: Soft, NT, ND, bowel sounds + Extremities: no edema, no cyanosis    The results of significant diagnostics from this hospitalization (including imaging, microbiology, ancillary and laboratory) are listed below for reference.     Microbiology: Recent Results (from the past 240 hour(s))  Blood culture (routine x 2)     Status: None (Preliminary result)   Collection Time: 10/11/17 11:16 PM  Result Value Ref Range Status   Specimen Description   Final    BLOOD RIGHT ANTECUBITAL Performed at [facility], [address] [place], [place] [address]    Special Requests   Final    BOTTLES DRAWN AEROBIC AND ANAEROBIC Blood Culture adequate volume Performed at [facility], [address] [place], [place] [address]    Culture   Final    NO GROWTH 2 DAYS Performed at [facility], [address], [place], [place] [address]    Report Status PENDING  Incomplete  Blood culture (routine x 2)     Status: None (Preliminary result)   Collection Time: 10/11/17 11:49 PM  Result Value  Ref Range Status   Specimen Description   Final    BLOOD LEFT HAND Performed at [facility], [address] [place], [place] [address]    Special Requests   Final    NONE Performed at [facility], [address] [place], [place] [address]    Culture   Final    NO GROWTH 2 DAYS Performed at [facility], [address], [place], [place] [address]    Report Status PENDING  Incomplete  Culture, Urine     Status: Abnormal  Collection Time: 10/12/17  6:04 AM  Result Value Ref Range Status   Specimen Description URINE, RANDOM  Final   Special Requests   Final    NONE Performed at [facility], [address] [place], [place] [address]    Culture MULTIPLE SPECIES PRESENT, SUGGEST RECOLLECTION (A)  Final   Report Status 10/13/2017 FINAL  Final     Labs: BNP (last 3 results) No results for input(s): BNP in the last 8760 hours. Basic Metabolic Panel: Recent Labs  Lab 10/11/17 2041 10/13/17 0425 10/15/17 0505  NA 137 139 138  K 3.3* 3.1* 3.7  CL 103 104 102  CO2 GLUCOSE 92 96 97  BUN 9 <5* <5*  CREATININE 0.89 0.76 0.79  CALCIUM 8.6* 8.3* 9.1   Liver Function Tests: Recent Labs  Lab 10/11/17 2041 10/13/17 0425  AST 19 12*  ALT 21 15  ALKPHOS 69 63  BILITOT 0.8 0.5  PROT 7.5 6.0*  ALBUMIN 3.6 2.6*   Recent Labs  Lab 10/11/17 2041  LIPASE 30   No results for input(s): AMMONIA in the last 168 hours. CBC: Recent Labs  Lab 10/11/17 2041 10/13/17 0425 10/14/17 0549 10/15/17 0505  WBC 9.7 7.0 5.5 6.2  HGB 13.9 11.5* 11.9* 13.7  HCT 41.1 36.1 37.2 42.4  MCV 87.8 89.1 88.8 87.6  PLT 215 181 204 241   Cardiac Enzymes: Recent Labs  Lab 10/11/17 2043 10/12/17 0051  TROPONINI <0.03 <0.03   BNP: Invalid input(s): POCBNP CBG: No results for input(s): GLUCAP in the last 168 hours. D-Dimer No results for input(s): DDIMER in the last 72 hours. Hgb A1c No results for input(s): HGBA1C in the last 72  hours. Lipid Profile No results for input(s): CHOL, HDL, LDLCALC, TRIG, CHOLHDL, LDLDIRECT in the last 72 hours. Thyroid function studies No results for input(s): TSH, T4TOTAL, T3FREE, THYROIDAB in the last 72 hours.  Invalid input(s): FREET3 Anemia work up No results for input(s): VITAMINB12, FOLATE, FERRITIN, TIBC, IRON, RETICCTPCT in the last 72 hours. Urinalysis    Component Value Date/Time   COLORURINE RED (A) 10/11/2017 2043   APPEARANCEUR TURBID (A) 10/11/2017 2043   LABSPEC 1.020 10/11/2017 2043   PHURINE 7.0 10/11/2017 2043   GLUCOSEU NEGATIVE 10/11/2017 2043   HGBUR LARGE (A) 10/11/2017 2043   BILIRUBINUR NEGATIVE 10/11/2017 2043   KETONESUR >80 (A) 10/11/2017 2043   PROTEINUR 30 (A) 10/11/2017 2043   UROBILINOGEN 1.0 12/28/2014 2000   NITRITE NEGATIVE 10/11/2017 2043   LEUKOCYTESUR TRACE (A) 10/11/2017 2043   Sepsis Labs Invalid input(s): PROCALCITONIN,  WBC,  LACTICIDVEN Microbiology Recent Results (from the past 240 hour(s))  Blood culture (routine x 2)     Status: None (Preliminary result)   Collection Time: 10/11/17 11:16 PM  Result Value Ref Range Status   Specimen Description   Final    BLOOD RIGHT ANTECUBITAL Performed at [facility], [address] [place], [place] [address]    Special Requests   Final    BOTTLES DRAWN AEROBIC AND ANAEROBIC Blood Culture adequate volume Performed at [facility], [address] [place], [place] [address]    Culture   Final    NO GROWTH 2 DAYS Performed at [facility], [address] [place], [place] [address]    Report Status PENDING  Incomplete  Blood culture (routine x 2)     Status: None (Preliminary result)   Collection Time: 10/11/17 11:49 PM  Result Value Ref Range Status   Specimen Description   Final    BLOOD LEFT HAND Performed at [facility], [address] [place], [place] [address]    Special Requests   Final    NONE Performed at [facility],  [address] [place], [place] [address]    Culture   Final    NO GROWTH 2 DAYS Performed at [facility], [address] [place], [place] [address]    Report Status PENDING  Incomplete  Culture, Urine     Status: Abnormal   Collection Time: 10/12/17  6:04 AM  Result Value Ref Range Status   Specimen Description URINE, RANDOM  Final   Special Requests   Final    NONE Performed at [facility], [address], [place], [place] [address]    Culture MULTIPLE SPECIES PRESENT, SUGGEST RECOLLECTION (A)  Final   Report Status 10/13/2017 FINAL  Final     Time coordinating discharge: 65 minutes The [place] controlled substances registry was reviewed for this patient prior to filling the <5 days supply controlled substances script, no abnormal activity, no recent prescriptions.      SIGNED:   Alberteen Sam, MD  [facility] 10/15/2017, 1:02 PM

## 2017-10-15 NOTE — Progress Notes (Signed)
Unsuccessful call attempt: NCM called patient insurance to check benefit for Lovenox at [phone], message states department is closed today due to [place].

## 2017-10-15 NOTE — Progress Notes (Signed)
ANTICOAGULATION CONSULT NOTE  Pharmacy Consult for Heparin Indication: portal vein thrombosis  Allergies  Allergen Reactions  . Ivp Dye [Iodinated Diagnostic Agents] Nausea And Vomiting    Patient Measurements: Height:  (180.3 cm) Weight: 265 lb 3.4 oz (120.3 kg) IBW/kg (Calculated) : 70.8 Heparin Dosing Weight: 100 kg  Vital Signs: Temp: 98.4 F (36.9 C) (05/26 2056) Temp Source: Oral (05/26 2056) BP: 134/91 (05/26 2056) Pulse Rate: 81 (05/26 2056)  Labs: Recent Labs    10/13/17 0425 10/14/17 0549 10/15/17 0505  HGB 11.5* 11.9* 13.7  HCT 36.1 37.2 42.4  PLT 181 204 241  LABPROT  --  13.4 13.7  INR  --  1.03 1.06  HEPARINUNFRC 0.56 0.63 0.56  CREATININE 0.76  --  0.79    Estimated Creatinine Clearance: 135 mL/min (by C-G formula based on SCr of 0.79 mg/dL).  Assessment: 42 y.o. female with portal vein thrombosis continues on IV heparin. Heparin level remains therapeutic on current drip. Hgb back to normal. Scr stable. No bleeding noted. Patient refused warfarin dose last night and adamant about starting Eliquis over warfarin per discussion with her yesterday and per overnight RN. Defer to Internal Med on final decision for anticoagulation for this patient.  Goal of Therapy:  Heparin level 0.3-0.7 units/ml Monitor platelets by anticoagulation protocol: Yes   Plan:  Continue heparin gtt to 1500 units/hr Daily heparin level, CBC Monitor clinical course, s/sx bleeding  Donnella Bi, PharmD PGY1 Acute Care Pharmacy Resident 10/15/2017,7:32 AM

## 2017-10-15 NOTE — Discharge Instructions (Signed)
Information on my medicine - ELIQUIS (apixaban)  Why was Eliquis prescribed for you? Eliquis was prescribed to treat blood clots that may have been found in the veins of your legs (deep vein thrombosis) or in your lungs (pulmonary embolism) and to reduce the risk of them occurring again.  What do You need to know about Eliquis ? The starting dose is 10 mg (two 5 mg tablets) taken TWICE daily for the FIRST SEVEN (7) DAYS, then on 10/22/2017 the dose is reduced to ONE 5 mg tablet taken TWICE daily.  Eliquis may be taken with or without food.   Try to take the dose about the same time in the morning and in the evening. If you have difficulty swallowing the tablet whole please discuss with your pharmacist how to take the medication safely.  Take Eliquis exactly as prescribed and DO NOT stop taking Eliquis without talking to the doctor who prescribed the medication.  Stopping may increase your risk of developing a new blood clot.  Refill your prescription before you run out.  After discharge, you should have regular check-up appointments with your healthcare provider that is prescribing your Eliquis.    What do you do if you miss a dose? If a dose of ELIQUIS is not taken at the scheduled time, take it as soon as possible on the same day and twice-daily administration should be resumed. The dose should not be doubled to make up for a missed dose.  Important Safety Information A possible side effect of Eliquis is bleeding. You should call your healthcare provider right away if you experience any of the following: ? Bleeding from an injury or your nose that does not stop. ? Unusual colored urine (red or dark brown) or unusual colored stools (red or black). ? Unusual bruising for unknown reasons. ? A serious fall or if you hit your head (even if there is no bleeding).  Some medicines may interact with Eliquis and might increase your risk of bleeding or clotting while on Eliquis. To help  avoid this, consult your healthcare provider or pharmacist prior to using any new prescription or non-prescription medications, including herbals, vitamins, non-steroidal anti-inflammatory drugs (NSAIDs) and supplements.  This website has more information on Eliquis (apixaban): http://www.eliquis.com/eliquis/home

## 2017-10-17 LAB — CULTURE, BLOOD (ROUTINE X 2)
CULTURE: NO GROWTH
Culture: NO GROWTH
Special Requests: ADEQUATE

## 2017-10-17 NOTE — Progress Notes (Signed)
BENEFIT CHECK Per CMA:  Memory Argue         # 1.  S/W NIDA  @ [employer] # [phone]    1. LOVENOX 120 MCG BID  COVER- NONE FORMULARY - BUT COVER  CO-PAY- $ 97.81  PRIOR APPROVAL - NO    2.ENOXAPARIN  120 MCG BID  COVER- YES  CO-PAY- $ 9.89  TIER- 1 DRUG  PRIOR APPROVAL- NO   DEDUCTIBLE : HAS BEEN MET   PREFERRED PHARMACY : YES  [facility]

## 2017-11-07 ENCOUNTER — Other Ambulatory Visit: Payer: Self-pay | Admitting: Family

## 2017-11-07 DIAGNOSIS — I81 Portal vein thrombosis: Secondary | ICD-10-CM

## 2017-11-08 ENCOUNTER — Inpatient Hospital Stay: Payer: BLUE CROSS/BLUE SHIELD

## 2017-11-08 ENCOUNTER — Encounter: Payer: Self-pay | Admitting: Family

## 2017-11-08 ENCOUNTER — Other Ambulatory Visit: Payer: Self-pay

## 2017-11-08 ENCOUNTER — Encounter: Payer: Self-pay | Admitting: Hematology & Oncology

## 2017-11-08 ENCOUNTER — Inpatient Hospital Stay: Payer: BLUE CROSS/BLUE SHIELD | Attending: Family | Admitting: Family

## 2017-11-08 VITALS — BP 150/98 | HR 61 | Temp 98.6°F | Resp 18 | Wt 261.0 lb

## 2017-11-08 DIAGNOSIS — Z86718 Personal history of other venous thrombosis and embolism: Secondary | ICD-10-CM | POA: Insufficient documentation

## 2017-11-08 DIAGNOSIS — I10 Essential (primary) hypertension: Secondary | ICD-10-CM | POA: Insufficient documentation

## 2017-11-08 DIAGNOSIS — Z91041 Radiographic dye allergy status: Secondary | ICD-10-CM

## 2017-11-08 DIAGNOSIS — Z8249 Family history of ischemic heart disease and other diseases of the circulatory system: Secondary | ICD-10-CM | POA: Insufficient documentation

## 2017-11-08 DIAGNOSIS — N96 Recurrent pregnancy loss: Secondary | ICD-10-CM | POA: Diagnosis not present

## 2017-11-08 DIAGNOSIS — I81 Portal vein thrombosis: Secondary | ICD-10-CM

## 2017-11-08 DIAGNOSIS — Z7901 Long term (current) use of anticoagulants: Secondary | ICD-10-CM | POA: Diagnosis not present

## 2017-11-08 LAB — CMP (CANCER CENTER ONLY)
ALK PHOS: 83 U/L (ref 40–150)
ALT: 32 U/L (ref 0–55)
AST: 28 U/L (ref 5–34)
Albumin: 3.7 g/dL (ref 3.5–5.0)
Anion gap: 8 (ref 3–11)
BILIRUBIN TOTAL: 0.6 mg/dL (ref 0.2–1.2)
BUN: 12 mg/dL (ref 7–26)
CALCIUM: 9.1 mg/dL (ref 8.4–10.4)
CHLORIDE: 107 mmol/L (ref 98–109)
CO2: 25 mmol/L (ref 22–29)
CREATININE: 0.86 mg/dL (ref 0.60–1.10)
Glucose, Bld: 78 mg/dL (ref 70–140)
Potassium: 3.5 mmol/L (ref 3.5–5.1)
Sodium: 140 mmol/L (ref 136–145)
TOTAL PROTEIN: 7.2 g/dL (ref 6.4–8.3)

## 2017-11-08 LAB — CBC WITH DIFFERENTIAL (CANCER CENTER ONLY)
BASOS ABS: 0 10*3/uL (ref 0.0–0.1)
Basophils Relative: 0 %
EOS ABS: 0.1 10*3/uL (ref 0.0–0.5)
EOS PCT: 2 %
HCT: 39 % (ref 34.8–46.6)
HEMOGLOBIN: 12.6 g/dL (ref 11.6–15.9)
Lymphocytes Relative: 40 %
Lymphs Abs: 1.7 10*3/uL (ref 0.9–3.3)
MCH: 29.1 pg (ref 26.0–34.0)
MCHC: 32.3 g/dL (ref 32.0–36.0)
MCV: 90.1 fL (ref 81.0–101.0)
Monocytes Absolute: 0.5 10*3/uL (ref 0.1–0.9)
Monocytes Relative: 11 %
NEUTROS PCT: 47 %
Neutro Abs: 2 10*3/uL (ref 1.5–6.5)
PLATELETS: 202 10*3/uL (ref 145–400)
RBC: 4.33 MIL/uL (ref 3.70–5.32)
RDW: 12.9 % (ref 11.1–15.7)
WBC: 4.2 10*3/uL (ref 3.9–10.0)

## 2017-11-08 LAB — LACTATE DEHYDROGENASE: LDH: 221 U/L (ref 125–245)

## 2017-11-08 LAB — ANTITHROMBIN III: ANTITHROMB III FUNC: 90 % (ref 75–120)

## 2017-11-08 LAB — D-DIMER, QUANTITATIVE (NOT AT ARMC)

## 2017-11-08 NOTE — Progress Notes (Signed)
Hematology/Oncology Consultation   Name: [name]      MRN: [id number]    Location: Room/bed info not found  Date: 11/08/2017 Time:11:11 AM   REFERRING PHYSICIAN: Christopher P. Danford, MD  REASON FOR CONSULT: Portal vein thrombosis    DIAGNOSIS:  Portal vein thrombosis  History of DVT  HISTORY OF PRESENT ILLNESS: [name] is a very pleasant 42 yo African American female with recent diagnosis of left portal vein thrombosis (complete occlusion). She was admitted to [facility] on May 23 and started on Heparin after appearing in the ED with what she though was chest pain from acid reflux. They stopped her birth control immediately. She was discharged on the 27th on Eliquis.  She had an US of both legs in September 2016 which was negative for DVT.  She is doing well on Eliquis but started her cycle yesterday and it is quite a bit heavier than her normal with small clots. No other bleeding, no bruising or petechiae.  She is interested in an IUD and plans to follow-up with her gynecologists about this. She verbalized understanding that she can not use any form of contraceptive containing estrogen.  She has history of 2 miscarriages at 5 months. She gave birth to her son at 7 months due to developing HELLP syndrome. She still has issues with HTN and is on Toprolol XL and Nifedipine.  She states that her mother also has history of thrombus and has been on anticoagulation.  No history of sickle cell disease or trait.  She has had occasional headaches and dizziness. No diabetes or thyroid issues.  No issue with infections. No fever, chills, n/v, cough, rash, SOB, chest pain, abdominal pain or changes in bowel or bladder habits.  No swelling, tenderness, numbness or tingling in her extremities. No c/o pain.  She has never been a smoker. She rarely has an alcoholic beverage socially.  She is staying active and works out at [employer] with  She has maintained a good appetite, eating a healthy low carb  diet and is staying well hydrated. Her weight is stable.  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 She also works a part time job.   ROS: All other 10 point review of systems is negative.   PAST MEDICAL HISTORY:   Past Medical History:  Diagnosis Date  . DVT (deep venous thrombosis) (HCC)   . Hernia, hiatal   . Hypertension   . PVT (portal vein thrombosis) 10/12/2017    ALLERGIES: Allergies  Allergen Reactions  . Ivp Dye [Iodinated Diagnostic Agents] Nausea And Vomiting      MEDICATIONS:  Current Outpatient Medications on File Prior to Visit  Medication Sig Dispense Refill  . apixaban (ELIQUIS) 5 MG TABS tablet Take 10 mg (2 tabs) twice daily for 7 days, then take 5 mg (1 tab) twice daily 194 tablet 0  . meclizine (ANTIVERT) 25 MG tablet Take 1 tablet (25 mg total) by mouth 3 (three) times daily as needed for dizziness. 30 tablet 0  . metoprolol succinate (TOPROL-XL) 25 MG 24 hr tablet Take 25 mg by mouth daily.    Marland Kitchen. NIFEdipine (PROCARDIA-XL/ADALAT CC) 60 MG 24 hr tablet Take 1 tablet (60 mg total) by mouth daily. 30 tablet 0  . ondansetron (ZOFRAN ODT) 4 MG disintegrating tablet Take 1 tablet (4 mg total) by mouth every 8 (eight) hours as needed for nausea. 6 tablet 0  . ondansetron (ZOFRAN) 4 MG tablet Take 1  tablet (4 mg total) by mouth daily as needed for nausea or vomiting. 30 tablet 1  . oxyCODONE (OXY IR/ROXICODONE) 5 MG immediate release tablet Take 1 tablet (5 mg total) by mouth every 6 (six) hours as needed for moderate pain. 20 tablet 0  . traZODone (DESYREL) 50 MG tablet Take 50 mg by mouth at bedtime as needed for sleep.     No current facility-administered medications on file prior to visit.      PAST SURGICAL HISTORY Past Surgical History:  Procedure Laterality Date  . APPENDECTOMY    . DILATION AND CURETTAGE OF UTERUS      FAMILY HISTORY: Family History  Problem Relation Age of Onset  . Hypertension Mother    . Diabetes Mother   . Diabetes Maternal Aunt   . Hypertension Maternal Aunt   . Diabetes Maternal Aunt   . Hypertension Maternal Aunt     SOCIAL HISTORY:  reports that she has never smoked. She has never used smokeless tobacco. She reports that she drinks alcohol. She reports that she does not use drugs.  PERFORMANCE STATUS: The patient's performance status is 1 - Symptomatic but completely ambulatory  PHYSICAL EXAM: Most Recent Vital Signs: Last menstrual period 10/11/2017. LMP 10/11/2017   General Appearance:    Alert, cooperative, no distress, appears stated age  Head:    Normocephalic, without obvious abnormality, atraumatic  Eyes:    PERRL, conjunctiva/corneas clear, EOM's intact, fundi    benign, both eyes             Throat:   Lips, mucosa, and tongue normal; teeth and gums normal  Neck:   Supple, symmetrical, trachea midline, no adenopathy;       thyroid:  No enlargement/tenderness/nodules; no carotid   bruit or JVD  Back:     Symmetric, no curvature, ROM normal, no CVA tenderness  Lungs:     Clear to auscultation bilaterally, respirations unlabored  Chest wall:    No tenderness or deformity  Heart:    Regular rate and rhythm, S1 and S2 normal, no murmur, rub   or gallop  Abdomen:     Soft, non-tender, bowel sounds active all four quadrants,    no masses, no organomegaly        Extremities:   Extremities normal, atraumatic, no cyanosis or edema  Pulses:   2+ and symmetric all extremities  Skin:   Skin color, texture, turgor normal, no rashes or lesions  Lymph nodes:   Cervical, supraclavicular, and axillary nodes normal  Neurologic:   CNII-XII intact. Normal strength, sensation and reflexes      throughout    LABORATORY DATA:  Results for orders placed or performed in visit on 11/08/17 (from the past 48 hour(s))  CBC with Differential (Cancer Center Only)     Status: None   Collection Time: 11/08/17 10:45 AM  Result Value Ref Range   WBC Count 4.2 3.9 - 10.0  K/uL   RBC 4.33 3.70 - 5.32 MIL/uL   Hemoglobin 12.6 11.6 - 15.9 g/dL   HCT 16.1 09.6 - 04.5 %   MCV 90.1 81.0 - 101.0 fL   MCH 29.1 26.0 - 34.0 pg   MCHC 32.3 32.0 - 36.0 g/dL   RDW 40.9 81.1 - 91.4 %   Platelet Count 202 145 - 400 K/uL   Neutrophils Relative % 47 %   Neutro Abs 2.0 1.5 - 6.5 K/uL   Lymphocytes Relative 40 %   Lymphs Abs 1.7 0.9 - 3.3  K/uL   Monocytes Relative 11 %   Monocytes Absolute 0.5 0.1 - 0.9 K/uL   Eosinophils Relative 2 %   Eosinophils Absolute 0.1 0.0 - 0.5 K/uL   Basophils Relative 0 %   Basophils Absolute 0.0 0.0 - 0.1 K/uL    Comment: Performed at [facility] at [facility], [address], [place], [place] [address]      RADIOGRAPHY: No results found.     PATHOLOGY: None  ASSESSMENT/PLAN: Ms. Abraha is a very pleasant 42 yo African American female with recent diagnosis of left portal vein thrombosis (complete occlusion). She also has history of 2 miscarriages at 5 months and birth of her son at 7 months due to HELLP syndrome.  She is doing well on Eliquis.  We will see what all her lab work shows and then determine how long she will require anticoagulation.  We will plan to see her back in another 2 months and repeat her CT scan that same day.   All questions were answered and she is in agreement with the plan. She will contact our office with any questions or concerns. We can certainly see her sooner if need be.  She was discussed with and also seen by Dr. Myna Hidalgo and he is in agreement with the aforementioned.   Emeline Gins     Addendum: I saw and examined the patient with Maralyn Sago.  I agree with the above assessment.  He will be interesting to see if there is any hypercoagulable state.  It is certainly possible that she has 1.  However, typically being African American, it would be unusual to find a hypercoagulable state.  From the labs so far, we have not found anything that may have triggered this  thrombus.  She has no liver issues as far as I can tell.  I would keep her on Eliquis.  I think she needs Eliquis for probably a year.  I would then see about put her on low-dose maintenance Eliquis.  She has had these miscarriages.  I think this is very interesting.  I would repeat a CT scan in 2 months or so and see how this thrombus has improved.  We spent about 40 minutes with her.  We spent all the time face-to-face with her.  We answered all of her questions.  She was quite reassured after seeing [place].  Christin Bach, MD She does have the

## 2017-11-09 LAB — HOMOCYSTEINE: Homocysteine: 14 umol/L (ref 0.0–15.0)

## 2017-11-09 MED ORDER — DIPHENHYDRAMINE HCL 50 MG PO TABS: 50.0000 mg | ORAL_TABLET | Freq: Once | ORAL | 0 refills | Status: DC

## 2017-11-09 MED ORDER — PREDNISONE 50 MG PO TABS
ORAL_TABLET | ORAL | 0 refills | Status: DC
Start: 2017-11-09 — End: 2018-01-10

## 2017-11-10 LAB — BETA-2-GLYCOPROTEIN I ABS, IGG/M/A
Beta-2 Glyco I IgG: <9 GPI IgG units (ref 0–20)
Beta-2-Glycoprotein I IgM: <9 GPI IgM units (ref 0–32)

## 2017-11-10 LAB — PROTEIN S, TOTAL: PROTEIN S AG TOTAL: 70 % (ref 60–150)

## 2017-11-10 LAB — PROTEIN C ACTIVITY: Protein C Activity: 120 % (ref 73–180)

## 2017-11-10 LAB — PROTEIN S ACTIVITY: PROTEIN S ACTIVITY: 72 % (ref 63–140)

## 2017-11-11 LAB — CARDIOLIPIN ANTIBODIES, IGG, IGM, IGA
Anticardiolipin IgG: <9 GPL U/mL (ref 0–14)
Anticardiolipin IgM: 11 MPL U/mL (ref 0–12)

## 2017-11-12 LAB — LUPUS ANTICOAGULANT PANEL
DRVVT: 49.3 s — ABNORMAL HIGH (ref 0.0–47.0)
PTT Lupus Anticoagulant: 35.5 s (ref 0.0–51.9)

## 2017-11-12 LAB — DRVVT MIX: DRVVT MIX: 43.4 s (ref 0.0–47.0)

## 2017-11-12 LAB — FACTOR 5 LEIDEN

## 2017-11-12 LAB — PROTEIN C, TOTAL: Protein C, Total: 110 % (ref 60–150)

## 2017-11-13 LAB — PROTHROMBIN GENE MUTATION

## 2017-11-30 LAB — JAK 2 V617F (GENPATH)

## 2017-12-29 ENCOUNTER — Other Ambulatory Visit: Payer: Self-pay

## 2017-12-29 ENCOUNTER — Encounter (HOSPITAL_BASED_OUTPATIENT_CLINIC_OR_DEPARTMENT_OTHER): Payer: Self-pay | Admitting: Emergency Medicine

## 2017-12-29 ENCOUNTER — Emergency Department (HOSPITAL_BASED_OUTPATIENT_CLINIC_OR_DEPARTMENT_OTHER): Payer: BLUE CROSS/BLUE SHIELD

## 2017-12-29 ENCOUNTER — Emergency Department (HOSPITAL_BASED_OUTPATIENT_CLINIC_OR_DEPARTMENT_OTHER)
Admission: EM | Admit: 2017-12-29 | Discharge: 2017-12-29 | Disposition: A | Discharge status: Home / Self Care | Payer: BLUE CROSS/BLUE SHIELD | Attending: Emergency Medicine | Admitting: Emergency Medicine

## 2017-12-29 DIAGNOSIS — M5412 Radiculopathy, cervical region: Secondary | ICD-10-CM | POA: Diagnosis not present

## 2017-12-29 DIAGNOSIS — R0789 Other chest pain: Secondary | ICD-10-CM | POA: Diagnosis present

## 2017-12-29 DIAGNOSIS — R600 Localized edema: Secondary | ICD-10-CM | POA: Insufficient documentation

## 2017-12-29 DIAGNOSIS — Z79899 Other long term (current) drug therapy: Secondary | ICD-10-CM | POA: Insufficient documentation

## 2017-12-29 DIAGNOSIS — I10 Essential (primary) hypertension: Secondary | ICD-10-CM | POA: Diagnosis not present

## 2017-12-29 DIAGNOSIS — Z7901 Long term (current) use of anticoagulants: Secondary | ICD-10-CM | POA: Diagnosis not present

## 2017-12-29 LAB — URINALYSIS, ROUTINE W REFLEX MICROSCOPIC
BILIRUBIN URINE: NEGATIVE
GLUCOSE, UA: NEGATIVE mg/dL
KETONES UR: 15 mg/dL — AB
LEUKOCYTES UA: NEGATIVE
Nitrite: NEGATIVE
PH: 6 (ref 5.0–8.0)
Protein, ur: NEGATIVE mg/dL
Specific Gravity, Urine: 1.02 (ref 1.005–1.030)

## 2017-12-29 LAB — CBC
HCT: 39.4 % (ref 36.0–46.0)
HEMOGLOBIN: 13.1 g/dL (ref 12.0–15.0)
MCH: 29.4 pg (ref 26.0–34.0)
MCHC: 33.2 g/dL (ref 30.0–36.0)
MCV: 88.5 fL (ref 78.0–100.0)
PLATELETS: 236 10*3/uL (ref 150–400)
RBC: 4.45 MIL/uL (ref 3.87–5.11)
RDW: 12.6 % (ref 11.5–15.5)
WBC: 5.7 10*3/uL (ref 4.0–10.5)

## 2017-12-29 LAB — URINALYSIS, MICROSCOPIC (REFLEX): WBC, UA: NONE SEEN WBC/hpf (ref 0–5)

## 2017-12-29 LAB — BASIC METABOLIC PANEL
ANION GAP: 7 (ref 5–15)
BUN: 12 mg/dL (ref 6–20)
CALCIUM: 8.1 mg/dL — AB (ref 8.9–10.3)
CO2: 25 mmol/L (ref 22–32)
CREATININE: 0.87 mg/dL (ref 0.44–1.00)
Chloride: 109 mmol/L (ref 98–111)
GFR calc non Af Amer: >60 mL/min (ref >60)
GLUCOSE: 95 mg/dL (ref 70–99)
Potassium: 3.5 mmol/L (ref 3.5–5.1)
Sodium: 141 mmol/L (ref 135–145)

## 2017-12-29 LAB — TROPONIN I

## 2017-12-29 LAB — PREGNANCY, URINE: PREG TEST UR: NEGATIVE

## 2017-12-29 IMAGING — CT CT ANGIO CHEST
2 of 8 series · 18 of 36 positions shown · IV contrast (iopamidol)
Comparison: CTA of the chest performed [DATE]

CLINICAL DATA: Acute onset of left arm and chest pain. Personal
history of deep venous thrombosis.

EXAM:
CT ANGIOGRAPHY CHEST WITH CONTRAST
TECHNIQUE: Multidetector CT imaging of the chest was performed using the
standard protocol during bolus administration of intravenous
contrast. Multiplanar CT image reconstructions and MIPs were
obtained to evaluate the vascular anatomy.
CONTRAST:  91mL [E2] IOPAMIDOL ([E2]) INJECTION 76%

[Series 9: pe thins · axial · 0.77mm/px · z∈[-30,+224]mm · 17 of 284 slices shown]
[im 15/284  lung]
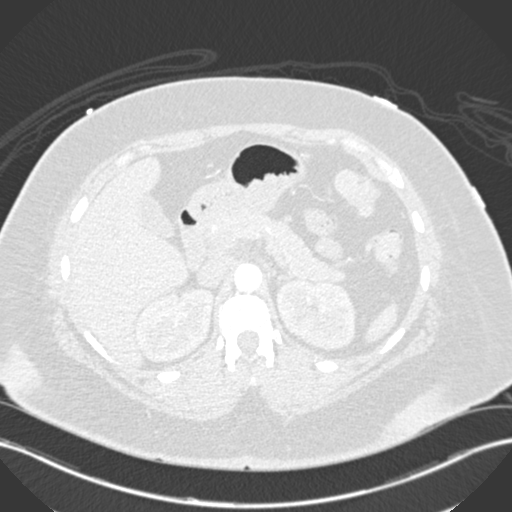
[im 30/284  mediastinal]
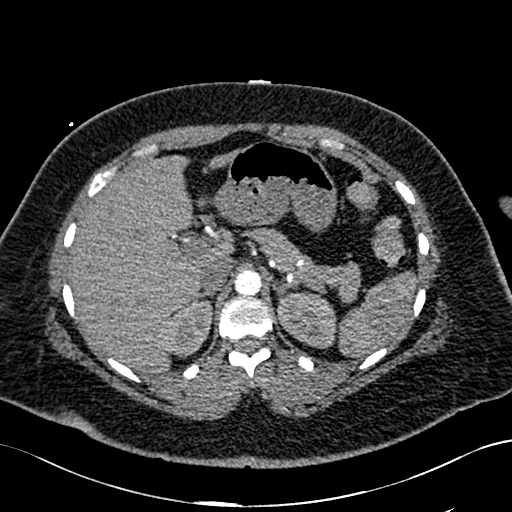
[im 45/284  lung]
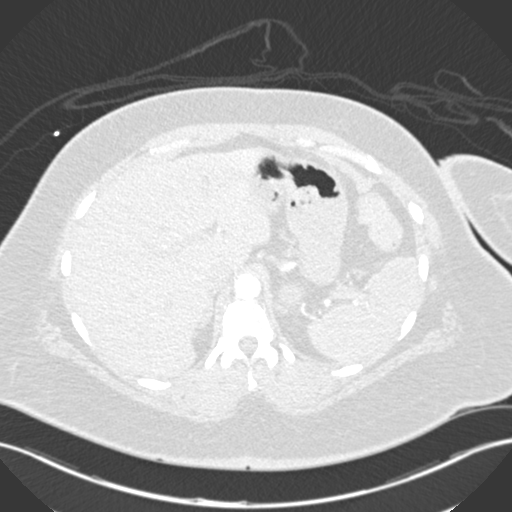
[im 60/284  mediastinal]
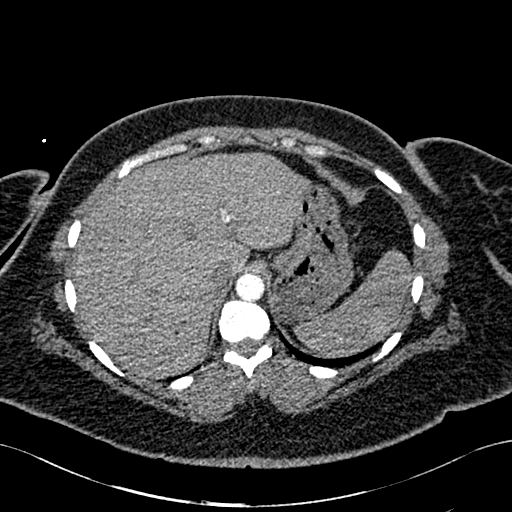
[im 75/284  lung]
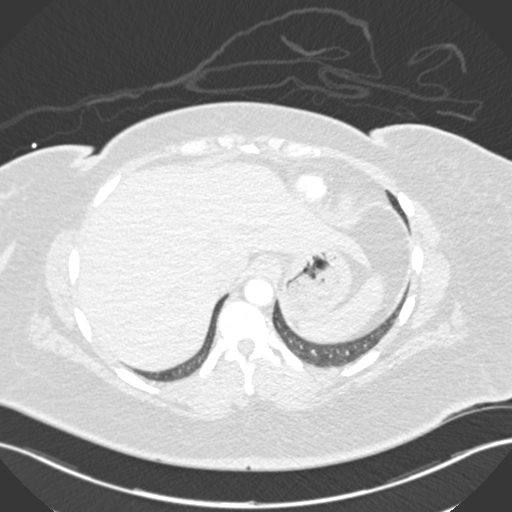
[im 90/284  mediastinal]
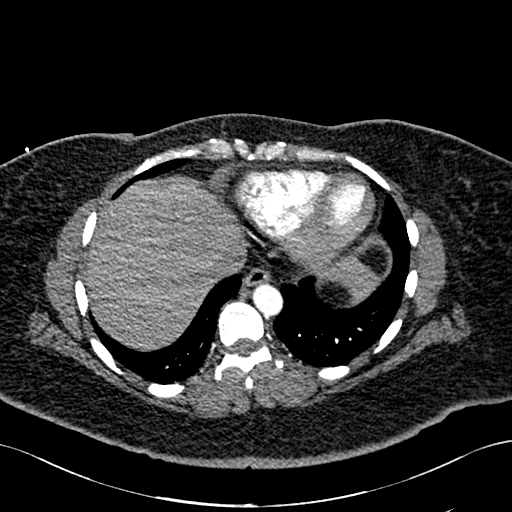
[im 105/284  lung]
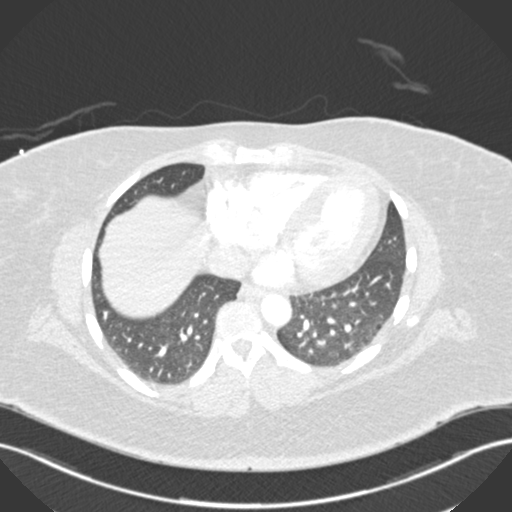
[im 120/284  mediastinal]
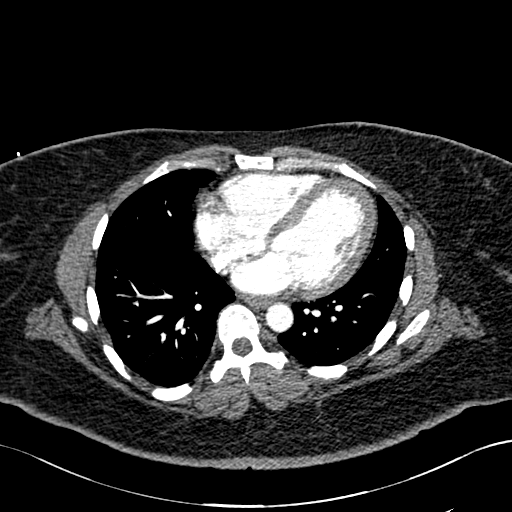
[im 149/284  lung]
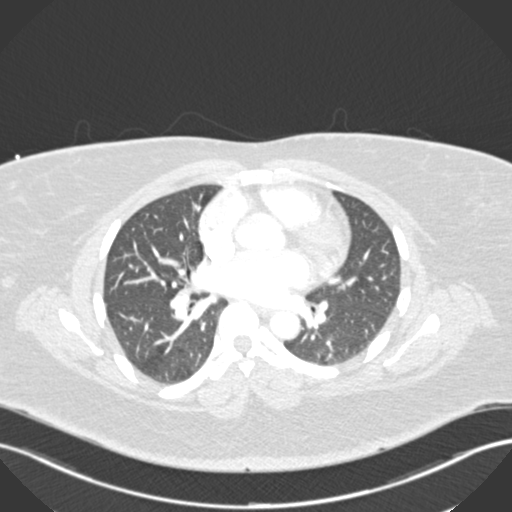
[im 164/284  mediastinal]
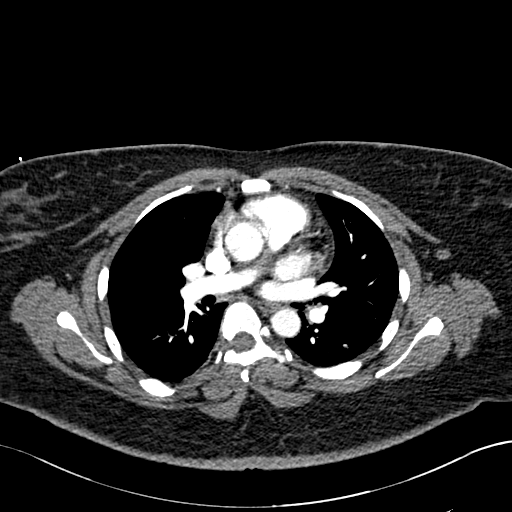
[im 179/284  lung]
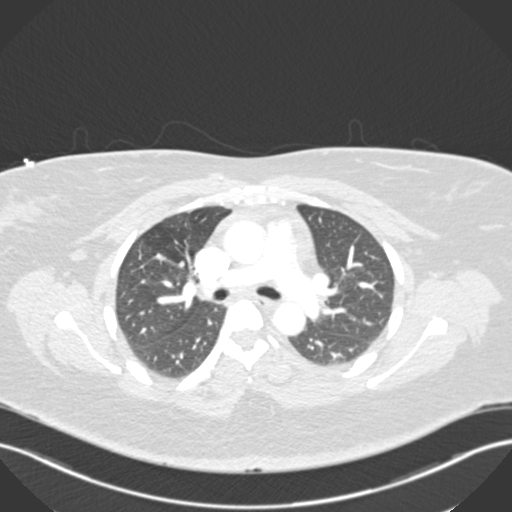
[im 194/284  mediastinal]
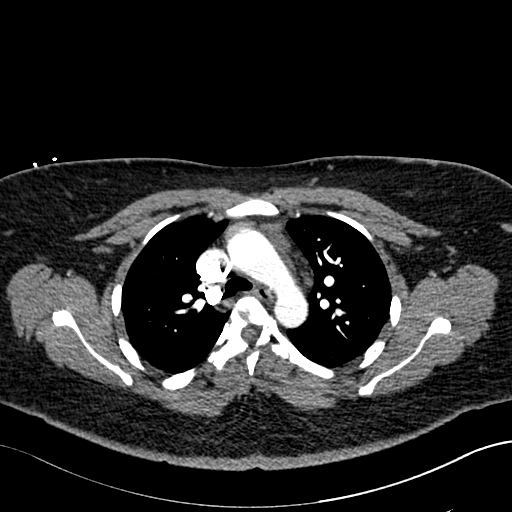
[im 209/284  lung]
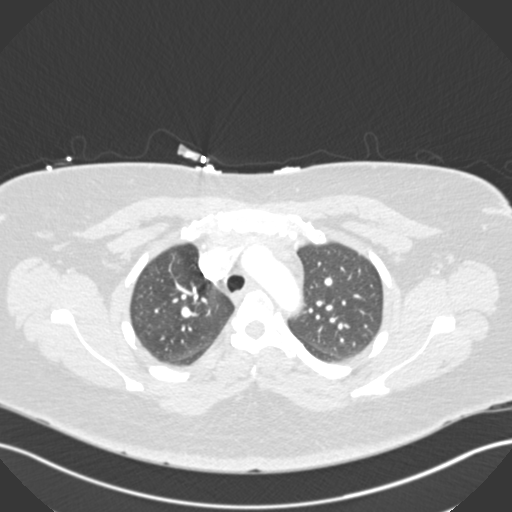
[im 224/284  mediastinal]
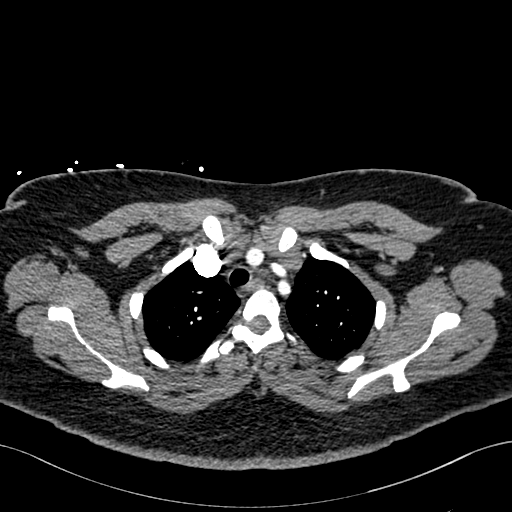
[im 239/284  lung]
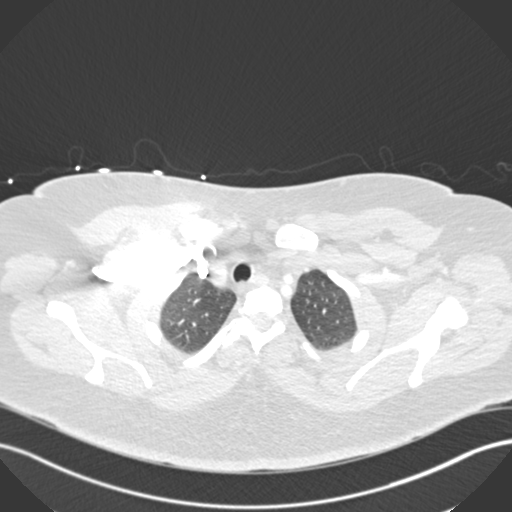
[im 254/284  mediastinal]
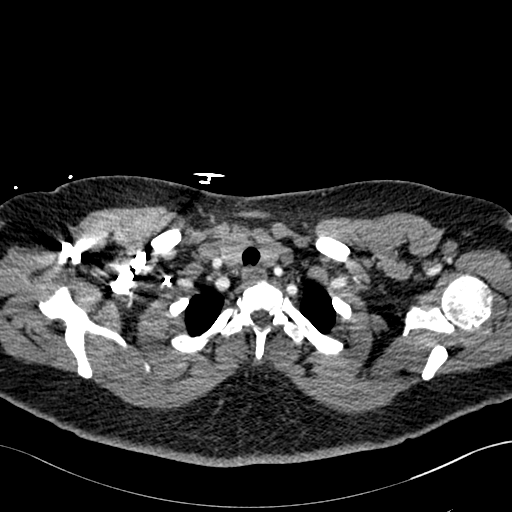
[im 269/284  lung]
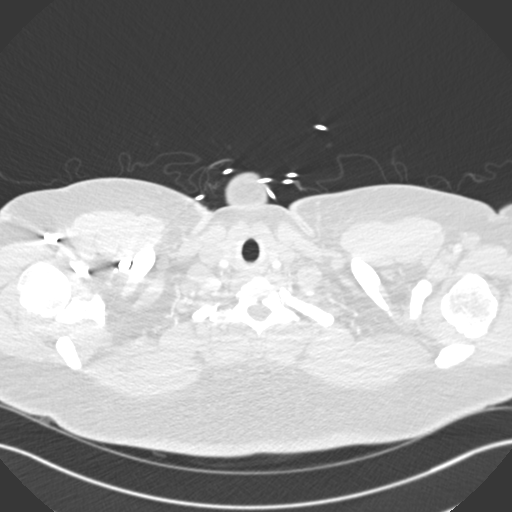

[Series 11: pe coronal mpr · coronal · 0.61mm/px · 1 of 132 slices shown]
[im 66/132  mediastinal]
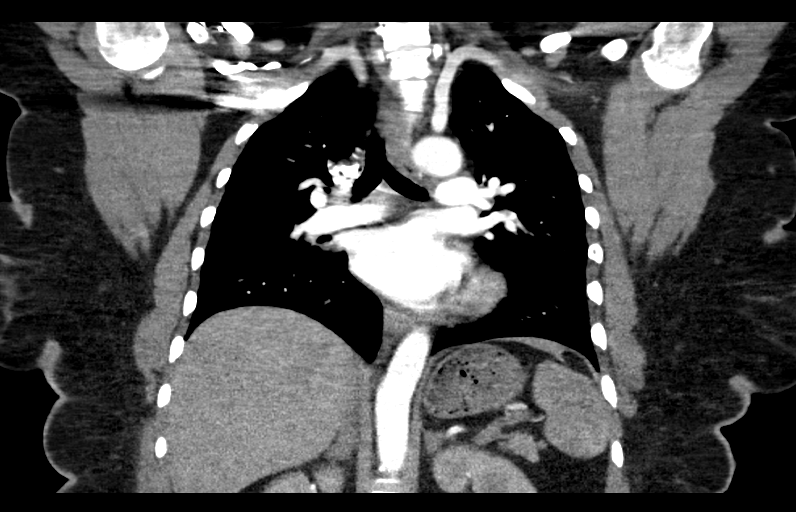

[18 of 36 positions shown; findings below may reference images not displayed]

FINDINGS: Cardiovascular:  There is no evidence of pulmonary embolus.

The heart is normal in size. The thoracic aorta is unremarkable. The
great vessels are within normal limits.

Mediastinum/Nodes: The mediastinum is unremarkable in appearance. No
mediastinal lymphadenopathy is seen. No pericardial effusion is
identified. The thyroid gland is unremarkable. No axillary
lymphadenopathy is seen.

Lungs/Pleura: The lungs are essentially clear bilaterally. No focal
consolidation, pleural effusion or pneumothorax is seen. No masses
are identified.

Upper Abdomen: The visualized portions of the liver and spleen are
unremarkable. The visualized portions of the gallbladder, pancreas,
adrenal glands and kidneys are within normal limits.

Musculoskeletal: No acute osseous abnormalities are identified. The
visualized musculature is unremarkable in appearance.

Review of the MIP images confirms the above findings.
IMPRESSION: 1. No evidence of pulmonary embolus.
2. Lungs clear bilaterally.

## 2017-12-29 MED ORDER — MORPHINE SULFATE (PF) 4 MG/ML IV SOLN
4.0000 mg | Freq: Once | INTRAVENOUS | Status: DC
  Filled 2017-12-29: qty 1

## 2017-12-29 MED ORDER — ONDANSETRON 4 MG PO TBDP: 4.0000 mg | ORAL_TABLET | Freq: Three times a day (TID) | ORAL | 0 refills | Status: DC | PRN

## 2017-12-29 MED ORDER — FAMOTIDINE 20 MG PO TABS: 20.0000 mg | ORAL_TABLET | Freq: Every day | ORAL | 0 refills | Status: AC

## 2017-12-29 MED ORDER — ONDANSETRON HCL 4 MG/2ML IJ SOLN
4.0000 mg | Freq: Once | INTRAMUSCULAR | Status: AC
  Administered 2017-12-29: 4 mg via INTRAVENOUS
  Filled 2017-12-29: qty 2

## 2017-12-29 MED ORDER — FAMOTIDINE 20 MG PO TABS
20.0000 mg | ORAL_TABLET | Freq: Once | ORAL | Status: AC
  Administered 2017-12-29: 20 mg via ORAL
  Filled 2017-12-29: qty 1

## 2017-12-29 MED ORDER — MORPHINE SULFATE (PF) 4 MG/ML IV SOLN
4.0000 mg | Freq: Once | INTRAVENOUS | Status: AC
  Administered 2017-12-29: 4 mg via INTRAVENOUS
  Filled 2017-12-29: qty 1

## 2017-12-29 MED ORDER — PROMETHAZINE HCL 25 MG/ML IJ SOLN
25.0000 mg | Freq: Once | INTRAMUSCULAR | Status: DC
  Filled 2017-12-29: qty 1

## 2017-12-29 MED ORDER — IOPAMIDOL (ISOVUE-370) INJECTION 76%
100.0000 mL | Freq: Once | INTRAVENOUS | Status: AC | PRN
  Administered 2017-12-29: 91 mL via INTRAVENOUS

## 2017-12-29 NOTE — ED Provider Notes (Signed)
[facility] EMERGENCY DEPARTMENT Provider Note   CSN: [id number] Arrival date & time: 12/29/17  1829     History   Chief Complaint Chief Complaint  Patient presents with  . Chest Pain    HPI Brenda Michael is a 42 y.o. female.  Pt presents to the ED today with cp.  The pt was diagnosed with portal vein thrombosis in May and was admitted to [facility] from 5/23-5/27 and was put on Eliquis.  Recently, she has not been compliant with her Eliquis b/c she's been working long hours.  Pt has had left leg swelling for several weeks.  She did go the ED at [facility] on 6/27 and had an [place] which showed no evidence of DVT, but she did have a baker's cyst.  Pt has been having cp for the past several days.  She denies sob.         Past Medical History:  Diagnosis Date  . DVT (deep venous thrombosis) (HCC)   . Hernia, hiatal   . Hypertension   . PVT (portal vein thrombosis) 10/12/2017    Patient Active Problem List   Diagnosis Date Noted  . Portal vein thrombosis 10/12/2017  . Galactorrhea 04/24/2011  . Hypertension 04/24/2011    Past Surgical History:  Procedure Laterality Date  . APPENDECTOMY    . DILATION AND CURETTAGE OF UTERUS       OB History    Gravida  2   Para      Term      Preterm      AB  1   Living  1     SAB  1   TAB      Ectopic      Multiple      Live Births               Home Medications    Prior to Admission medications   Medication Sig Start Date End Date Taking? Authorizing Provider  apixaban (ELIQUIS) 5 MG TABS tablet Take 10 mg (2 tabs) twice daily for 7 days, then take 5 mg (1 tab) twice daily 10/15/17   Danford, Earl Lites, MD  diphenhydrAMINE (BENADRYL) 50 MG tablet Take 1 tablet (50 mg total) by mouth once for 1 dose. 1 hour before scan. 11/09/17 11/09/17  [place], Brand Males, NP  famotidine (PEPCID) 20 MG tablet Take 1 tablet (20 mg total) by mouth daily. 12/29/17   Jacalyn Lefevre, MD  meclizine (ANTIVERT) 25 MG  tablet Take 1 tablet (25 mg total) by mouth 3 (three) times daily as needed for dizziness. 12/13/15   Rolland Porter, MD  metoprolol succinate (TOPROL-XL) 25 MG 24 hr tablet Take 25 mg by mouth daily.    [provider]  NIFEdipine (PROCARDIA-XL/ADALAT CC) 60 MG 24 hr tablet Take 1 tablet (60 mg total) by mouth daily. 10/15/17   Danford, Earl Lites, MD  ondansetron (ZOFRAN ODT) 4 MG disintegrating tablet Take 1 tablet (4 mg total) by mouth every 8 (eight) hours as needed. 12/29/17   Jacalyn Lefevre, MD  predniSONE (DELTASONE) 50 MG tablet Take 1 tablet by mouth 13 hours before scan, 1 tablet by mouth 7 hours before scan and 1 tablet by mouth 1 hour before scan. 11/09/17   [place], Brand Males, NP  traZODone (DESYREL) 50 MG tablet Take 50 mg by mouth at bedtime as needed for sleep.    [provider]    Family History Family History  Problem Relation Age of  Onset  . Hypertension Mother   . Diabetes Mother   . Diabetes Maternal Aunt   . Hypertension Maternal Aunt   . Diabetes Maternal Aunt   . Hypertension Maternal Aunt     Social History Social History   Tobacco Use  . Smoking status: Never Smoker  . Smokeless tobacco: Never Used  Substance Use Topics  . Alcohol use: Yes    Comment: occ  . Drug use: No     Allergies   Iodinated diagnostic agents and Other   Review of Systems Review of Systems  Cardiovascular: Positive for chest pain.  Musculoskeletal:       Left leg swelling  All other systems reviewed and are negative.    Physical Exam Updated Vital Signs BP (!) 159/99 (BP Location: Left Arm)   Pulse (!) 55   Temp 98.5 F (36.9 C) (Oral)   Resp 14   Ht 5\' 11"  (1.803 m)   Wt 115.7 kg   LMP 12/29/2017   SpO2 100%   BMI 35.57 kg/m   Physical Exam  Constitutional: She is oriented to person, place, and time. She appears well-developed and well-nourished.  HENT:  Head: Normocephalic and atraumatic.  Eyes: Pupils are equal, round, and reactive to  light. EOM are normal.  Neck: Normal range of motion. Neck supple.  Cardiovascular: Normal rate, regular rhythm, intact distal pulses and normal pulses.  Pulmonary/Chest: Effort normal and breath sounds normal.  Abdominal: Soft. Bowel sounds are normal.  Musculoskeletal: Normal range of motion.       Right lower leg: Normal.       Left lower leg: She exhibits edema.  Neurological: She is alert and oriented to person, place, and time.  Skin: Skin is warm and dry. Capillary refill takes less than 2 seconds.  Psychiatric: She has a normal mood and affect. Her behavior is normal.  Nursing note and vitals reviewed.    ED Treatments / Results  Labs (all labs ordered are listed, but only abnormal results are displayed) Labs Reviewed  BASIC METABOLIC PANEL - Abnormal; Notable for the following components:      Result Value   Calcium 8.1 (*)    All other components within normal limits  URINALYSIS, ROUTINE W REFLEX MICROSCOPIC - Abnormal; Notable for the following components:   Hgb urine dipstick MODERATE (*)    Ketones, ur 15 (*)    All other components within normal limits  URINALYSIS, MICROSCOPIC (REFLEX) - Abnormal; Notable for the following components:   Bacteria, UA MANY (*)    All other components within normal limits  CBC  TROPONIN I  PREGNANCY, URINE    EKG EKG Interpretation  Date/Time:  Saturday December 29 2017 18:41:49 EDT Ventricular Rate:  73 PR Interval:  176 QRS Duration: 74 QT Interval:  400 QTC Calculation: 440 R Axis:   58 Text Interpretation:  Normal sinus rhythm Nonspecific T wave abnormality Abnormal ECG Confirmed by Jacalyn Lefevre [phone]) on 12/29/2017 6:57:16 PM   Radiology Ct Angio Chest Pe W And/or Wo Contrast  Result Date: 12/29/2017 CLINICAL DATA:  Acute onset of left arm and chest pain. Personal history of deep venous thrombosis. EXAM: CT ANGIOGRAPHY CHEST WITH CONTRAST TECHNIQUE: Multidetector CT imaging of the chest was performed using the  standard protocol during bolus administration of intravenous contrast. Multiplanar CT image reconstructions and MIPs were obtained to evaluate the vascular anatomy. CONTRAST:  91mL ISOVUE-370 IOPAMIDOL (ISOVUE-370) INJECTION 76% COMPARISON:  CTA of the chest performed 10/12/2017 FINDINGS: Cardiovascular:  There is no evidence of pulmonary embolus. The heart is normal in size. The thoracic aorta is unremarkable. The great vessels are within normal limits. Mediastinum/Nodes: The mediastinum is unremarkable in appearance. No mediastinal lymphadenopathy is seen. No pericardial effusion is identified. The thyroid gland is unremarkable. No axillary lymphadenopathy is seen. Lungs/Pleura: The lungs are essentially clear bilaterally. No focal consolidation, pleural effusion or pneumothorax is seen. No masses are identified. Upper Abdomen: The visualized portions of the liver and spleen are unremarkable. The visualized portions of the gallbladder, pancreas, adrenal glands and kidneys are within normal limits. Musculoskeletal: No acute osseous abnormalities are identified. The visualized musculature is unremarkable in appearance. Review of the MIP images confirms the above findings. IMPRESSION: 1. No evidence of pulmonary embolus. 2. Lungs clear bilaterally. Electronically Signed   By: Roanna RaiderJeffery  Chang M.D.   On: 12/29/2017 21:18   [place] Venous Img Lower Unilateral Left  Result Date: 12/29/2017 CLINICAL DATA:  Worsen left leg swelling. EXAM: LEFT LOWER EXTREMITY VENOUS DOPPLER ULTRASOUND TECHNIQUE: Gray-scale sonography with graded compression, as well as color Doppler and duplex ultrasound were performed to evaluate the lower extremity deep venous systems from the level of the common femoral vein and including the common femoral, femoral, profunda femoral, popliteal and calf veins including the posterior tibial, peroneal and gastrocnemius veins when visible. The superficial great saphenous vein was also interrogated. Spectral  Doppler was utilized to evaluate flow at rest and with distal augmentation maneuvers in the common femoral, femoral and popliteal veins. COMPARISON:  None. FINDINGS: Contralateral Common Femoral Vein: Respiratory phasicity is normal and symmetric with the symptomatic side. No evidence of thrombus. Normal compressibility. Common Femoral Vein: No evidence of thrombus. Normal compressibility, respiratory phasicity and response to augmentation. Saphenofemoral Junction: No evidence of thrombus. Normal compressibility and flow on color Doppler imaging. Profunda Femoral Vein: No evidence of thrombus. Normal compressibility and flow on color Doppler imaging. Femoral Vein: No evidence of thrombus. Normal compressibility, respiratory phasicity and response to augmentation. Popliteal Vein: No evidence of thrombus. Normal compressibility, respiratory phasicity and response to augmentation. Calf Veins: No evidence of thrombus. Normal compressibility and flow on color Doppler imaging. Superficial Great Saphenous Vein: No evidence of thrombus. Normal compressibility. Venous Reflux:  None. Other Findings:  None. IMPRESSION: No evidence of deep venous thrombosis. Electronically Signed   By: Sherian ReinWei-Chen  Lin M.D.   On: 12/29/2017 21:00    Procedures Procedures (including critical care time)  Medications Ordered in ED Medications  ondansetron (ZOFRAN) injection 4 mg (has no administration in time range)  famotidine (PEPCID) tablet 20 mg (has no administration in time range)  ondansetron (ZOFRAN) injection 4 mg (4 mg Intravenous Given 12/29/17 1932)  morphine 4 MG/ML injection 4 mg (4 mg Intravenous Given 12/29/17 1933)  iopamidol (ISOVUE-370) 76 % injection 100 mL (91 mLs Intravenous Contrast Given 12/29/17 2042)     Initial Impression / Assessment and Plan / ED Course  I have reviewed the triage vital signs and the nursing notes.  Pertinent labs & imaging results that were available during my care of the patient were  reviewed by me and considered in my medical decision making (see chart for details).     Pt is feeling much better.  CP is atypical and not from PE.  Left arm numbness likely from radiculopathy.  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  She is also encouraged to take her Eliquis as directed.  Return if worse.  Final Clinical  Impressions(s) / ED Diagnoses   Final diagnoses:  Atypical chest pain  Cervical radiculopathy    ED Discharge Orders         Ordered    famotidine (PEPCID) 20 MG tablet  Daily     12/29/17 2214    ondansetron (ZOFRAN ODT) 4 MG disintegrating tablet  Every 8 hours PRN     12/29/17 2214           Jacalyn Lefevre, MD 12/29/17 2239

## 2017-12-29 NOTE — ED Notes (Signed)
RN to room to medicate pt. Pt states she does not want anything that would keep her from driving home.

## 2017-12-29 NOTE — Discharge Instructions (Signed)
Take your Eliquis as directed by your doctor.

## 2017-12-29 NOTE — ED Triage Notes (Signed)
Patient states that she has had pain to her chest and left arm since last night  - she reports that it worsened today  - denies any SOB or any other compliants. She reports a hx of blood clots in the pain

## 2018-01-10 ENCOUNTER — Other Ambulatory Visit: Payer: Self-pay | Admitting: *Deleted

## 2018-01-10 ENCOUNTER — Other Ambulatory Visit: Payer: BLUE CROSS/BLUE SHIELD

## 2018-01-10 ENCOUNTER — Ambulatory Visit (HOSPITAL_BASED_OUTPATIENT_CLINIC_OR_DEPARTMENT_OTHER): Admission: RE | Admit: 2018-01-10 | Payer: BLUE CROSS/BLUE SHIELD | Source: Ambulatory Visit

## 2018-01-10 ENCOUNTER — Ambulatory Visit: Payer: BLUE CROSS/BLUE SHIELD | Admitting: Family

## 2018-01-10 DIAGNOSIS — I81 Portal vein thrombosis: Secondary | ICD-10-CM

## 2018-01-10 DIAGNOSIS — Z91041 Radiographic dye allergy status: Secondary | ICD-10-CM

## 2018-01-10 MED ORDER — PREDNISONE 50 MG PO TABS: ORAL_TABLET | ORAL | 2 refills | Status: DC

## 2018-01-10 MED ORDER — DIPHENHYDRAMINE HCL 50 MG PO TABS
50.0000 mg | ORAL_TABLET | Freq: Once | ORAL | 2 refills | Status: DC
Start: 2018-01-10 — End: 2018-07-08

## 2018-01-18 ENCOUNTER — Encounter: Payer: Self-pay | Admitting: Hematology & Oncology

## 2018-01-24 ENCOUNTER — Ambulatory Visit (HOSPITAL_BASED_OUTPATIENT_CLINIC_OR_DEPARTMENT_OTHER): Admission: RE | Admit: 2018-01-24 | Payer: BLUE CROSS/BLUE SHIELD | Source: Ambulatory Visit

## 2018-07-08 ENCOUNTER — Emergency Department (HOSPITAL_BASED_OUTPATIENT_CLINIC_OR_DEPARTMENT_OTHER): Payer: BLUE CROSS/BLUE SHIELD

## 2018-07-08 ENCOUNTER — Emergency Department (HOSPITAL_BASED_OUTPATIENT_CLINIC_OR_DEPARTMENT_OTHER)
Admission: EM | Admit: 2018-07-08 | Discharge: 2018-07-08 | Disposition: A | Discharge status: Home / Self Care | Payer: BLUE CROSS/BLUE SHIELD | Attending: Emergency Medicine | Admitting: Emergency Medicine

## 2018-07-08 ENCOUNTER — Other Ambulatory Visit: Payer: Self-pay

## 2018-07-08 ENCOUNTER — Encounter (HOSPITAL_BASED_OUTPATIENT_CLINIC_OR_DEPARTMENT_OTHER): Payer: Self-pay

## 2018-07-08 DIAGNOSIS — I10 Essential (primary) hypertension: Secondary | ICD-10-CM | POA: Diagnosis not present

## 2018-07-08 DIAGNOSIS — R0789 Other chest pain: Secondary | ICD-10-CM | POA: Diagnosis not present

## 2018-07-08 DIAGNOSIS — Z7901 Long term (current) use of anticoagulants: Secondary | ICD-10-CM | POA: Insufficient documentation

## 2018-07-08 DIAGNOSIS — R079 Chest pain, unspecified: Secondary | ICD-10-CM | POA: Diagnosis present

## 2018-07-08 DIAGNOSIS — Z79899 Other long term (current) drug therapy: Secondary | ICD-10-CM | POA: Diagnosis not present

## 2018-07-08 LAB — URINALYSIS, ROUTINE W REFLEX MICROSCOPIC
Bilirubin Urine: NEGATIVE
GLUCOSE, UA: NEGATIVE mg/dL
Ketones, ur: NEGATIVE mg/dL
Nitrite: NEGATIVE
Protein, ur: NEGATIVE mg/dL
Specific Gravity, Urine: 1.025 (ref 1.005–1.030)
pH: 6.5 (ref 5.0–8.0)

## 2018-07-08 LAB — BASIC METABOLIC PANEL
Anion gap: 5 (ref 5–15)
BUN: 13 mg/dL (ref 6–20)
CO2: 27 mmol/L (ref 22–32)
CREATININE: 0.74 mg/dL (ref 0.44–1.00)
Calcium: 8.7 mg/dL — ABNORMAL LOW (ref 8.9–10.3)
Chloride: 105 mmol/L (ref 98–111)
GFR calc Af Amer: >60 mL/min (ref >60)
GLUCOSE: 76 mg/dL (ref 70–99)
POTASSIUM: 3.4 mmol/L — AB (ref 3.5–5.1)
Sodium: 137 mmol/L (ref 135–145)

## 2018-07-08 LAB — URINALYSIS, MICROSCOPIC (REFLEX)

## 2018-07-08 LAB — CBC
HCT: 40.9 % (ref 36.0–46.0)
HEMOGLOBIN: 12.5 g/dL (ref 12.0–15.0)
MCH: 28.3 pg (ref 26.0–34.0)
MCHC: 30.6 g/dL (ref 30.0–36.0)
MCV: 92.5 fL (ref 80.0–100.0)
Platelets: 212 10*3/uL (ref 150–400)
RBC: 4.42 MIL/uL (ref 3.87–5.11)
RDW: 13.2 % (ref 11.5–15.5)
WBC: 10.1 10*3/uL (ref 4.0–10.5)
nRBC: 0 % (ref 0.0–0.2)

## 2018-07-08 LAB — TROPONIN I: Troponin I: <0.03 ng/mL (ref <0.03)

## 2018-07-08 LAB — PREGNANCY, URINE: Preg Test, Ur: NEGATIVE

## 2018-07-08 MED ORDER — FAMOTIDINE 20 MG PO TABS: 20.0000 mg | ORAL_TABLET | Freq: Two times a day (BID) | ORAL | 0 refills | Status: AC

## 2018-07-08 MED ORDER — ALUM & MAG HYDROXIDE-SIMETH 200-200-20 MG/5ML PO SUSP
30.0000 mL | Freq: Once | ORAL | Status: AC
  Administered 2018-07-08: 30 mL via ORAL
  Filled 2018-07-08: qty 30

## 2018-07-08 MED ORDER — SODIUM CHLORIDE 0.9% FLUSH
3.0000 mL | Freq: Once | INTRAVENOUS | Status: DC
  Filled 2018-07-08: qty 3

## 2018-07-08 MED ORDER — TRAMADOL HCL 50 MG PO TABS: ORAL_TABLET | ORAL | 0 refills | Status: AC

## 2018-07-08 MED ORDER — FAMOTIDINE 20 MG PO TABS
40.0000 mg | ORAL_TABLET | Freq: Once | ORAL | Status: AC
  Administered 2018-07-08: 40 mg via ORAL
  Filled 2018-07-08: qty 2

## 2018-07-08 NOTE — Discharge Instructions (Signed)
1.  Start taking Pepcid twice daily every day.  Take it in the morning and eat a small amount of food about 20 to 30 minutes later.  Then, you may take 1-2 tramadol tablets for chest pain.  You may take 1-2 tramadol at lunchtime with food and after dinner.  Start decreasing tramadol as soon as pain is improving.  Continue Pepcid twice daily for at least 2 weeks. 2.  At this time, your pain is very reproducible to pressure on the chest wall and labs and vital signs are normal.  Your symptoms are not improving or you are getting new or worsening symptoms, return to the emergency department immediately for recheck. 3.  Continue your Eliquis and blood pressure medications as prescribed. 4.  Call your doctor's office in the morning to schedule a recheck this week.

## 2018-07-08 NOTE — ED Notes (Signed)
PT states understanding of care given, follow up care, and medication prescribed. PT ambulated from ED to car with a steady gait. 

## 2018-07-08 NOTE — ED Triage Notes (Signed)
C/o CP day 2-NAD-steady gait 

## 2018-07-08 NOTE — ED Provider Notes (Signed)
[facility] EMERGENCY DEPARTMENT Provider Note   CSN: [id number] Arrival date & time: 07/08/18  1707    History   Chief Complaint Chief Complaint  Patient presents with  . Chest Pain    HPI Brenda Michael is a 43 y.o. female.     HPI Patient reports that she has been working from home this weekend.  She does computer work.  She reports while sitting at her computer she was noticing she was starting to feel sometimes slightly lightheaded she reports then subsequently yesterday she started noting that she had some discomfort in her central chest.  Is been general pressure and burning kind of discomfort in the central chest.  Worse with movements and deep breaths.  No syncope.  No fever.  No cough.  Patient reports that she went on a cruise in November and was noncompliant with her Eliquis at that time.  She has subsequently resumed her Eliquis. Past Medical History:  Diagnosis Date  . DVT (deep venous thrombosis) (HCC)   . Hernia, hiatal   . Hypertension   . PVT (portal vein thrombosis) 10/12/2017    Patient Active Problem List   Diagnosis Date Noted  . Portal vein thrombosis 10/12/2017  . Galactorrhea 04/24/2011  . Hypertension 04/24/2011    Past Surgical History:  Procedure Laterality Date  . APPENDECTOMY    . DILATION AND CURETTAGE OF UTERUS       OB History    Gravida  2   Para      Term      Preterm      AB  1   Living  1     SAB  1   TAB      Ectopic      Multiple      Live Births               Home Medications    Prior to Admission medications   Medication Sig Start Date End Date Taking? Authorizing Provider  levonorgestrel (MIRENA) 20 MCG/24HR IUD by Intrauterine route. 01/29/18  Yes [provider]  amLODipine (NORVASC) 5 MG tablet  03/09/18   [provider]  apixaban (ELIQUIS) 5 MG TABS tablet Take 10 mg (2 tabs) twice daily for 7 days, then take 5 mg (1 tab) twice daily 10/15/17   Danford, Earl Lites, MD  famotidine (PEPCID) 20 MG tablet Take 1 tablet (20 mg total) by mouth daily. 12/29/17   Jacalyn Lefevre, MD  famotidine (PEPCID) 20 MG tablet Take 1 tablet (20 mg total) by mouth 2 (two) times daily. 07/08/18   Arby Barrette, MD  metoprolol succinate (TOPROL-XL) 25 MG 24 hr tablet Take 25 mg by mouth daily.    [provider]  NIFEdipine (PROCARDIA-XL/ADALAT CC) 60 MG 24 hr tablet Take 1 tablet (60 mg total) by mouth daily. 10/15/17   Danford, Earl Lites, MD  traMADol (ULTRAM) 50 MG tablet TK 1 T PO Q 6 H PRF MENSTRUAL CRAMP PAIN. 06/17/18   [provider]  traMADol (ULTRAM) 50 MG tablet 1-2 tablets every 6 hours as needed for pain. 07/08/18   Arby Barrette, MD  traZODone (DESYREL) 50 MG tablet Take 50 mg by mouth at bedtime as needed for sleep.    [provider]    Family History Family History  Problem Relation Age of Onset  . Hypertension Mother   . Diabetes Mother   . Diabetes Maternal Aunt   . Hypertension Maternal Aunt   .  Diabetes Maternal Aunt   . Hypertension Maternal Aunt     Social History Social History   Tobacco Use  . Smoking status: Never Smoker  . Smokeless tobacco: Never Used  Substance Use Topics  . Alcohol use: Yes    Comment: occ  . Drug use: No     Allergies   Iodinated diagnostic agents and Other   Review of Systems Review of Systems 10 Systems reviewed and are negative for acute change except as noted in the HPI.   Physical Exam Updated Vital Signs BP (!) 158/95   Pulse 66   Temp 98.9 F (37.2 C) (Oral)   Resp (!) 21   Ht 5\' 11"  (1.803 m)   Wt 128.4 kg   LMP 07/01/2018   SpO2 100%   BMI 39.47 kg/m   Physical Exam Constitutional:      Comments: Is alert and nontoxic.  Clinically well in appearance.  No respiratory distress.  HENT:     Head: Normocephalic and atraumatic.  Eyes:     Extraocular Movements: Extraocular movements intact.  Cardiovascular:     Rate and Rhythm: Normal rate and regular  rhythm.     Pulses: Normal pulses.     Heart sounds: Normal heart sounds.  Pulmonary:     Effort: Pulmonary effort is normal.     Breath sounds: Normal breath sounds.     Comments: Sound symmetric.  No wheeze rhonchi or rale.  Patient does have severe pain to palpation over the central sternum. Chest:     Chest wall: Tenderness present.  Abdominal:     General: There is no distension.     Palpations: Abdomen is soft.     Tenderness: There is no abdominal tenderness. There is no guarding.  Musculoskeletal: Normal range of motion.        General: No swelling or tenderness.     Comments: Bilateral lower extremities are symmetric.  Calves are soft and nontender.  No pitting edema.  Dorsalis pedis pulses are 2+ and symmetric.  Skin:    General: Skin is warm and dry.  Neurological:     General: No focal deficit present.     Mental Status: She is oriented to person, place, and time.     Coordination: Coordination normal.  Psychiatric:        Mood and Affect: Mood normal.      ED Treatments / Results  Labs (all labs ordered are listed, but only abnormal results are displayed) Labs Reviewed  BASIC METABOLIC PANEL - Abnormal; Notable for the following components:      Result Value   Potassium 3.4 (*)    Calcium 8.7 (*)    All other components within normal limits  URINALYSIS, ROUTINE W REFLEX MICROSCOPIC - Abnormal; Notable for the following components:   APPearance HAZY (*)    Hgb urine dipstick MODERATE (*)    Leukocytes,Ua MODERATE (*)    All other components within normal limits  URINALYSIS, MICROSCOPIC (REFLEX) - Abnormal; Notable for the following components:   Bacteria, UA MANY (*)    All other components within normal limits  CBC  TROPONIN I  PREGNANCY, URINE    EKG EKG Interpretation  Date/Time:  Monday July 08 2018 17:14:36 EST Ventricular Rate:  66 PR Interval:  176 QRS Duration: 74 QT Interval:  410 QTC Calculation: 429 R Axis:   36 Text  Interpretation:  Normal sinus rhythm Normal ECG no change Confirmed by Arby Barrette [phone]) on 07/08/2018 7:10:52 PM  Radiology Dg Chest 2 View  Result Date: 07/08/2018 CLINICAL DATA:  Chest pain EXAM: CHEST - 2 VIEW COMPARISON:  10/11/2017 FINDINGS: The heart size and mediastinal contours are within normal limits. Both lungs are clear. The visualized skeletal structures are unremarkable. IMPRESSION: No active cardiopulmonary disease. Electronically Signed   By: Jasmine PangKim  Fujinaga M.D.   On: 07/08/2018 17:47    Procedures Procedures (including critical care time)  Medications Ordered in ED Medications  famotidine (PEPCID) tablet 40 mg (40 mg Oral Given 07/08/18 1959)  alum & mag hydroxide-simeth (MAALOX/MYLANTA) 200-200-20 MG/5ML suspension 30 mL (30 mLs Oral Given 07/08/18 1959)     Initial Impression / Assessment and Plan / ED Course  I have reviewed the triage vital signs and the nursing notes.  Pertinent labs & imaging results that were available during my care of the patient were reviewed by me and considered in my medical decision making (see chart for details).       Patient is alert and clinically well in appearance.  At this time no fever.  Chest x-ray is clear.  EKG unchanged from previous.  Troponin negative.  Patient does have a history of DVT and is on Eliquis chronically.  At this time, have low suspicion for pulmonary embolus as etiology.  Patient's vital signs are stable.  No hypoxia.  Pain is atypical being central reproducible on examination.  Patient does not have lower extremity edema or swelling.  Patient does have documented history of hiatal hernia and reports having reflux at times.  She does not take regular reflux medication.  At this time suggestion is to start Pepcid twice daily and tramadol for chest pain.  Patient is counseled for close follow-up with PCP.  Return precautions reviewed.  Final Clinical Impressions(s) / ED Diagnoses   Final diagnoses:  Chest  wall pain  Nonspecific chest pain    ED Discharge Orders         Ordered    famotidine (PEPCID) 20 MG tablet  2 times daily     07/08/18 2045    traMADol (ULTRAM) 50 MG tablet     07/08/18 2045           Arby BarrettePfeiffer, Corleone Biegler, MD 07/08/18 2102

## 2018-07-08 NOTE — ED Notes (Signed)
C/o mid sternal cp x 2 days  And shortness of breath  Which is increased w exertion

## 2019-03-28 ENCOUNTER — Encounter (HOSPITAL_BASED_OUTPATIENT_CLINIC_OR_DEPARTMENT_OTHER): Payer: Self-pay | Admitting: Emergency Medicine

## 2019-03-28 ENCOUNTER — Emergency Department (HOSPITAL_BASED_OUTPATIENT_CLINIC_OR_DEPARTMENT_OTHER)
Admission: EM | Admit: 2019-03-28 | Discharge: 2019-03-28 | Disposition: A | Discharge status: Home / Self Care | Payer: Self-pay | Attending: Emergency Medicine | Admitting: Emergency Medicine

## 2019-03-28 ENCOUNTER — Emergency Department (HOSPITAL_BASED_OUTPATIENT_CLINIC_OR_DEPARTMENT_OTHER): Payer: Self-pay

## 2019-03-28 ENCOUNTER — Other Ambulatory Visit: Payer: Self-pay

## 2019-03-28 DIAGNOSIS — I10 Essential (primary) hypertension: Secondary | ICD-10-CM | POA: Insufficient documentation

## 2019-03-28 DIAGNOSIS — Z79899 Other long term (current) drug therapy: Secondary | ICD-10-CM | POA: Insufficient documentation

## 2019-03-28 DIAGNOSIS — G8929 Other chronic pain: Secondary | ICD-10-CM | POA: Insufficient documentation

## 2019-03-28 DIAGNOSIS — Z7901 Long term (current) use of anticoagulants: Secondary | ICD-10-CM | POA: Insufficient documentation

## 2019-03-28 DIAGNOSIS — M25562 Pain in left knee: Secondary | ICD-10-CM | POA: Insufficient documentation

## 2019-03-28 DIAGNOSIS — Z86718 Personal history of other venous thrombosis and embolism: Secondary | ICD-10-CM | POA: Insufficient documentation

## 2019-03-28 MED ORDER — ACETAMINOPHEN 325 MG PO TABS
650.0000 mg | ORAL_TABLET | Freq: Once | ORAL | Status: AC
  Administered 2019-03-28: 650 mg via ORAL
  Filled 2019-03-28: qty 2

## 2019-03-28 NOTE — ED Provider Notes (Signed)
[facility] EMERGENCY DEPARTMENT Provider Note   CSN: [id number] Arrival date & time: 03/28/19  [id number]     History   Chief Complaint No chief complaint on file.   HPI Brenda Michael is a 43 y.o. female with PMH significant for prior DVT, HTN, portal vein thrombosis who presents for L leg pain that has been ongoing for the past year, worsening for the past month with more exquisite pain the past 2 weeks. She went to see her PCP last week who gave her an intramuscular steroid injection for pain and instructed on knee compression and voltaren gel prn which she has been compliant with. She notes worsened and progressive swelling and pain located behind her L knee that extends down her shin and has made it difficult to walk. She is concerned about a new blood clot as she has a h/o DVT and PVT, is compliant with home eliquis since 10/2017. Denies recent trauma or falls. Denies chest pain, shortness of breath.      Past Medical History:  Diagnosis Date  . DVT (deep venous thrombosis) (HCC)   . Hernia, hiatal   . Hypertension   . PVT (portal vein thrombosis) 10/12/2017    Patient Active Problem List   Diagnosis Date Noted  . Portal vein thrombosis 10/12/2017  . Galactorrhea 04/24/2011  . Hypertension 04/24/2011    Past Surgical History:  Procedure Laterality Date  . APPENDECTOMY    . DILATION AND CURETTAGE OF UTERUS       OB History    Gravida  2   Para      Term      Preterm      AB  1   Living  1     SAB  1   TAB      Ectopic      Multiple      Live Births               Home Medications    Prior to Admission medications   Medication Sig Start Date End Date Taking? Authorizing Provider  amLODipine (NORVASC) 5 MG tablet  03/09/18  Yes [provider]  apixaban (ELIQUIS) 5 MG TABS tablet Take 10 mg (2 tabs) twice daily for 7 days, then take 5 mg (1 tab) twice daily 10/15/17  Yes Danford, Earl Liteshristopher P, MD  metoprolol succinate  (TOPROL-XL) 25 MG 24 hr tablet Take 25 mg by mouth daily.   Yes [provider]  famotidine (PEPCID) 20 MG tablet Take 1 tablet (20 mg total) by mouth daily. 12/29/17   Jacalyn LefevreHaviland, Julie, MD  famotidine (PEPCID) 20 MG tablet Take 1 tablet (20 mg total) by mouth 2 (two) times daily. 07/08/18   Arby BarrettePfeiffer, Marcy, MD  levonorgestrel (MIRENA) 20 MCG/24HR IUD by Intrauterine route. 01/29/18   [provider]  naproxen (NAPROSYN) 500 MG tablet Take 500 mg by mouth 2 (two) times daily. 12/01/18   [provider]  NIFEdipine (PROCARDIA-XL/ADALAT CC) 60 MG 24 hr tablet Take 1 tablet (60 mg total) by mouth daily. 10/15/17   Danford, Earl Liteshristopher P, MD  traMADol (ULTRAM) 50 MG tablet TK 1 T PO Q 6 H PRF MENSTRUAL CRAMP PAIN. 06/17/18   [provider]  traMADol (ULTRAM) 50 MG tablet 1-2 tablets every 6 hours as needed for pain. 07/08/18   Arby BarrettePfeiffer, Marcy, MD  traZODone (DESYREL) 50 MG tablet Take 50 mg by mouth at bedtime as needed for sleep.    [provider]  Family History Family History  Problem Relation Age of Onset  . Hypertension Mother   . Diabetes Mother   . Diabetes Maternal Aunt   . Hypertension Maternal Aunt   . Diabetes Maternal Aunt   . Hypertension Maternal Aunt     Social History Social History   Tobacco Use  . Smoking status: Never Smoker  . Smokeless tobacco: Never Used  Substance Use Topics  . Alcohol use: Yes    Comment: occ  . Drug use: No     Allergies   Iodinated diagnostic agents and Other   Review of Systems Review of Systems  Constitutional: Negative for fever.  Respiratory: Negative for chest tightness and shortness of breath.   Cardiovascular: Positive for leg swelling. Negative for chest pain.  Gastrointestinal: Negative for nausea and vomiting.  Musculoskeletal: Positive for gait problem.     Physical Exam Updated Vital Signs BP (!) 141/99 (BP Location: Right Arm)   Pulse 69   Temp 98.7 F (37.1 C) (Oral)    Resp 16   Ht 5\' 11"  (1.803 m)   Wt 131.5 kg   LMP 03/14/2019   SpO2 98%   BMI 40.45 kg/m   Physical Exam Constitutional:      General: She is not in acute distress.    Appearance: Normal appearance. She is obese. She is not ill-appearing, toxic-appearing or diaphoretic.  Cardiovascular:     Rate and Rhythm: Normal rate and regular rhythm.     Heart sounds: Normal heart sounds. No murmur.  Pulmonary:     Effort: No respiratory distress.     Breath sounds: Normal breath sounds. No wheezing, rhonchi or rales.  Abdominal:     General: Bowel sounds are normal.     Palpations: Abdomen is soft.     Tenderness: There is no abdominal tenderness.  Musculoskeletal:     Comments: Large fluctuant popliteal cyst, tender to palpation. No pitting edema or calf asymmetry noted. No joint laxity or joint line tenderness appreciated. Full strength of plantar and dorsiflexion.   Skin:    General: Skin is warm and dry.  Neurological:     Mental Status: She is alert and oriented to person, place, and time. Mental status is at baseline.     ED Treatments / Results  Labs (all labs ordered are listed, but only abnormal results are displayed) Labs Reviewed - No data to display  EKG None  Radiology No results found.  Procedures Procedures (including critical care time)  Medications Ordered in ED Medications  acetaminophen (TYLENOL) tablet 650 mg (650 mg Oral Given 03/28/19 1135)     Initial Impression / Assessment and Plan / ED Course  I have reviewed the triage vital signs and the nursing notes.  Pertinent labs & imaging results that were available during my care of the patient were reviewed by me and considered in my medical decision making (see chart for details).   Overweight 43yo F who presents with worsened L knee pain 2/2 known Baker's cyst with difficulties walking. Vitals within normal limits and is without tachycardia or shortness of breath. On exam, large popliteal cyst  appreciated consistent with prior Baker's cyst. Patient has good PROM of knee although with some pain and is without h/o trauma or joint laxity or point tenderness to concern for fracture or tear. Patient has been compliant with anticoagulation with [place] 12/2017 negative for DVT, therefore clot would not be likely, also history and exam not consistent with new clot. However given patient  concerns and prior history, obtained updated LE doppler US after extensive discussion with patient about low likelihood of clot. Results pending at the time of checkout.  Given patient has been using compression and topical analgesics for the past week only, likely has not allowed sufficient time to reduce swelling and pain 2/2 Baker's cyst. Would likely advise patient to continue compression and topical analgesics with Orthopedic followup in 4-6 weeks if pain is continuing if ultrasound results negative.      Final Clinical Impressions(s) / ED Diagnoses   Final diagnoses:  None    ED Discharge Orders    None       Ellwood Dense, DO 03/28/19 1230    Little, Ambrose Finland, MD 03/28/19 1448

## 2019-03-28 NOTE — ED Notes (Signed)
ED Provider at bedside. 

## 2019-03-28 NOTE — ED Triage Notes (Signed)
Left leg pain x 1 month, worse with bending, pain behind knee. Was eval by PCP x 1 week ago, wearing brace and using cream prescribed

## 2019-03-28 NOTE — ED Notes (Signed)
Returned to room from US.

## 2019-03-28 NOTE — Discharge Instructions (Addendum)
You were seen today for L knee pain that was thought to be due to your known Baker's cyst. You had a lower extremity ultrasound which did not show a new blood clot. It is recommended to continue with knee compression, and pain relief with topical voltaren gel as you are doing. This can take up to 4-6 weeks to heal. If you are still having pain and swelling at that time, it is recommended to follow up with Orthopedics for further intervention. It will also be helpful to lose weight to reduce the pressure and weight burden on your knees.

## 2022-07-07 ENCOUNTER — Emergency Department (HOSPITAL_BASED_OUTPATIENT_CLINIC_OR_DEPARTMENT_OTHER): Payer: Medicaid Other

## 2022-07-07 ENCOUNTER — Encounter (HOSPITAL_BASED_OUTPATIENT_CLINIC_OR_DEPARTMENT_OTHER): Payer: Self-pay | Admitting: Emergency Medicine

## 2022-07-07 ENCOUNTER — Other Ambulatory Visit: Payer: Self-pay

## 2022-07-07 ENCOUNTER — Emergency Department (HOSPITAL_BASED_OUTPATIENT_CLINIC_OR_DEPARTMENT_OTHER)
Admission: EM | Admit: 2022-07-07 | Discharge: 2022-07-07 | Disposition: A | Discharge status: Home / Self Care | Payer: Self-pay | Attending: Emergency Medicine | Admitting: Emergency Medicine

## 2022-07-07 DIAGNOSIS — M545 Low back pain, unspecified: Secondary | ICD-10-CM | POA: Insufficient documentation

## 2022-07-07 DIAGNOSIS — Z7901 Long term (current) use of anticoagulants: Secondary | ICD-10-CM | POA: Insufficient documentation

## 2022-07-07 LAB — CBC WITH DIFFERENTIAL/PLATELET
Abs Immature Granulocytes: 0.01 10*3/uL (ref 0.00–0.07)
Basophils Absolute: 0 10*3/uL (ref 0.0–0.1)
Basophils Relative: 1 %
Eosinophils Absolute: 0.2 10*3/uL (ref 0.0–0.5)
Eosinophils Relative: 3 %
HCT: 42.2 % (ref 36.0–46.0)
Hemoglobin: 13.2 g/dL (ref 12.0–15.0)
Immature Granulocytes: 0 %
Lymphocytes Relative: 40 %
Lymphs Abs: 2.5 10*3/uL (ref 0.7–4.0)
MCH: 28.1 pg (ref 26.0–34.0)
MCHC: 31.3 g/dL (ref 30.0–36.0)
MCV: 90 fL (ref 80.0–100.0)
Monocytes Absolute: 0.5 10*3/uL (ref 0.1–1.0)
Monocytes Relative: 8 %
Neutro Abs: 3 10*3/uL (ref 1.7–7.7)
Neutrophils Relative %: 48 %
Platelets: 265 10*3/uL (ref 150–400)
RBC: 4.69 MIL/uL (ref 3.87–5.11)
RDW: 14.3 % (ref 11.5–15.5)
WBC: 6.2 10*3/uL (ref 4.0–10.5)
nRBC: 0 % (ref 0.0–0.2)

## 2022-07-07 LAB — URINALYSIS, ROUTINE W REFLEX MICROSCOPIC
Bilirubin Urine: NEGATIVE
Glucose, UA: NEGATIVE mg/dL
Hgb urine dipstick: NEGATIVE
Ketones, ur: 15 mg/dL — AB
Leukocytes,Ua: NEGATIVE
Nitrite: NEGATIVE
Protein, ur: 30 mg/dL — AB
Specific Gravity, Urine: 1.025 (ref 1.005–1.030)
pH: 6 (ref 5.0–8.0)

## 2022-07-07 LAB — COMPREHENSIVE METABOLIC PANEL
ALT: 13 U/L (ref 0–44)
AST: 20 U/L (ref 15–41)
Albumin: 3.7 g/dL (ref 3.5–5.0)
Alkaline Phosphatase: 72 U/L (ref 38–126)
Anion gap: 9 (ref 5–15)
BUN: 11 mg/dL (ref 6–20)
CO2: 26 mmol/L (ref 22–32)
Calcium: 8.9 mg/dL (ref 8.9–10.3)
Chloride: 102 mmol/L (ref 98–111)
Creatinine, Ser: 0.73 mg/dL (ref 0.44–1.00)
GFR, Estimated: >60 mL/min (ref >60)
Glucose, Bld: 80 mg/dL (ref 70–99)
Potassium: 3.5 mmol/L (ref 3.5–5.1)
Sodium: 137 mmol/L (ref 135–145)
Total Bilirubin: 0.4 mg/dL (ref 0.3–1.2)
Total Protein: 7.7 g/dL (ref 6.5–8.1)

## 2022-07-07 LAB — URINALYSIS, MICROSCOPIC (REFLEX)

## 2022-07-07 LAB — LIPASE, BLOOD: Lipase: 40 U/L (ref 11–51)

## 2022-07-07 LAB — PREGNANCY, URINE: Preg Test, Ur: NEGATIVE

## 2022-07-07 MED ORDER — LIDOCAINE 5 % EX PTCH
1.0000 | MEDICATED_PATCH | CUTANEOUS | Status: DC
  Administered 2022-07-07: 1 via TRANSDERMAL
  Filled 2022-07-07: qty 1

## 2022-07-07 MED ORDER — KETOROLAC TROMETHAMINE 15 MG/ML IJ SOLN
15.0000 mg | Freq: Once | INTRAMUSCULAR | Status: AC
  Administered 2022-07-07: 15 mg via INTRAVENOUS
  Filled 2022-07-07: qty 1

## 2022-07-07 MED ORDER — CYCLOBENZAPRINE HCL 10 MG PO TABS: 10.0000 mg | ORAL_TABLET | Freq: Two times a day (BID) | ORAL | 0 refills | Status: AC | PRN

## 2022-07-07 MED ORDER — METHYLPREDNISOLONE 4 MG PO TBPK: ORAL_TABLET | ORAL | 0 refills | Status: AC

## 2022-07-07 MED ORDER — LIDOCAINE 5 % EX PTCH: 1.0000 | MEDICATED_PATCH | CUTANEOUS | 0 refills | Status: AC

## 2022-07-07 NOTE — ED Provider Notes (Signed)
[place] [facility] Provider Note   CSN: [id number] Arrival date & time: 07/07/22  1905     History  Chief Complaint  Patient presents with   Flank Pain    Brenda Michael is a 47 y.o. female.  Patient here with left flank pain for the last few weeks.  History of blood clot on Eliquis.  No history of kidney stones.  Pain mostly in the left lower back.  Had some in the right lower back earlier this week but now mostly left lower back.  Denies any numbness, denies any loss of bowel or bladder.  Denies any pain urination.  No hematuria.  Nothing makes it worse or better.  Denies any chest pain or shortness of breath.  The history is provided by the patient.       Home Medications Prior to Admission medications   Medication Sig Start Date End Date Taking? Authorizing Provider  cyclobenzaprine (FLEXERIL) 10 MG tablet Take 1 tablet (10 mg total) by mouth 2 (two) times daily as needed for muscle spasms. 07/07/22  Yes Shaquina Gillham, DO  lidocaine (LIDODERM) 5 % Place 1 patch onto the skin daily. Remove & Discard patch within 12 hours or as directed by MD 07/07/22  Yes Ronnald Nian, Jessilyn Catino, DO  methylPREDNISolone (MEDROL DOSEPAK) 4 MG TBPK tablet Follow package insert 07/07/22  Yes Haiden Clucas, DO  amLODipine (NORVASC) 5 MG tablet  03/09/18   [provider]  apixaban (ELIQUIS) 5 MG TABS tablet Take 10 mg (2 tabs) twice daily for 7 days, then take 5 mg (1 tab) twice daily 10/15/17   Danford, Suann Larry, MD  famotidine (PEPCID) 20 MG tablet Take 1 tablet (20 mg total) by mouth daily. 12/29/17   Isla Pence, MD  famotidine (PEPCID) 20 MG tablet Take 1 tablet (20 mg total) by mouth 2 (two) times daily. 07/08/18   Charlesetta Shanks, MD  levonorgestrel (MIRENA) 20 MCG/24HR IUD by Intrauterine route. 01/29/18   [provider]  metoprolol succinate (TOPROL-XL) 25 MG 24 hr tablet Take 25 mg by mouth daily.    [provider]  naproxen  (NAPROSYN) 500 MG tablet Take 500 mg by mouth 2 (two) times daily. 12/01/18   [provider]  NIFEdipine (PROCARDIA-XL/ADALAT CC) 60 MG 24 hr tablet Take 1 tablet (60 mg total) by mouth daily. 10/15/17   Danford, Suann Larry, MD  traMADol (ULTRAM) 50 MG tablet TK 1 T PO Q 6 H PRF MENSTRUAL CRAMP PAIN. 06/17/18   [provider]  traMADol (ULTRAM) 50 MG tablet 1-2 tablets every 6 hours as needed for pain. 07/08/18   Charlesetta Shanks, MD  traZODone (DESYREL) 50 MG tablet Take 50 mg by mouth at bedtime as needed for sleep.    [provider]      Allergies    Iodinated contrast media and Other    Review of Systems   Review of Systems  Physical Exam Updated Vital Signs BP (!) 132/99 (BP Location: Left Arm)   Pulse 72   Temp 98.1 F (36.7 C) (Oral)   Resp 16   Ht 5' 11"$  (1.803 m)   Wt 106.6 kg   LMP 07/03/2022 (Exact Date)   SpO2 100%   BMI 32.78 kg/m  Physical Exam Vitals and nursing note reviewed.  Constitutional:      General: She is not in acute distress.    Appearance: She is well-developed. She is not ill-appearing.  HENT:  Head: Normocephalic and atraumatic.  Eyes:     Extraocular Movements: Extraocular movements intact.     Conjunctiva/sclera: Conjunctivae normal.     Pupils: Pupils are equal, round, and reactive to light.  Cardiovascular:     Rate and Rhythm: Normal rate and regular rhythm.     Pulses: Normal pulses.     Heart sounds: No murmur heard. Pulmonary:     Effort: Pulmonary effort is normal. No respiratory distress.     Breath sounds: Normal breath sounds.  Abdominal:     Palpations: Abdomen is soft.     Tenderness: There is no abdominal tenderness. There is left CVA tenderness.  Musculoskeletal:        General: No swelling.     Cervical back: Normal range of motion and neck supple.     Comments: No midline spinal tenderness  Skin:    General: Skin is warm and dry.     Capillary Refill: Capillary refill takes less than 2  seconds.  Neurological:     General: No focal deficit present.     Mental Status: She is alert and oriented to person, place, and time.     Cranial Nerves: No cranial nerve deficit.     Sensory: No sensory deficit.     Motor: No weakness.     Coordination: Coordination normal.  Psychiatric:        Mood and Affect: Mood normal.     ED Results / Procedures / Treatments   Labs (all labs ordered are listed, but only abnormal results are displayed) Labs Reviewed  URINALYSIS, ROUTINE W REFLEX MICROSCOPIC - Abnormal; Notable for the following components:      Result Value   APPearance CLOUDY (*)    Ketones, ur 15 (*)    Protein, ur 30 (*)    All other components within normal limits  URINALYSIS, MICROSCOPIC (REFLEX) - Abnormal; Notable for the following components:   Bacteria, UA MANY (*)    All other components within normal limits  CBC WITH DIFFERENTIAL/PLATELET  COMPREHENSIVE METABOLIC PANEL  LIPASE, BLOOD  PREGNANCY, URINE    EKG None  Radiology CT Renal Stone Study  Result Date: 07/07/2022 CLINICAL DATA:  Low back pain for 2 weeks EXAM: CT ABDOMEN AND PELVIS WITHOUT CONTRAST CT LUMBAR SPINE WITHOUT CONTRAST TECHNIQUE: Multidetector CT imaging of the abdomen and pelvis was performed following the standard protocol without IV contrast. Multidetector CT imaging of the lumbar spine was performed following the standard protocol without IV contrast. RADIATION DOSE REDUCTION: This exam was performed according to the departmental dose-optimization program which includes automated exposure control, adjustment of the mA and/or kV according to patient size and/or use of iterative reconstruction technique. COMPARISON:  None Available. FINDINGS: CT ABDOMEN PELVIS FINDINGS Lower chest: Minimal scarring or atelectasis of the left lung base. Hepatobiliary: No solid liver abnormality is seen. No gallstones, gallbladder wall thickening, or biliary dilatation. Pancreas: Unremarkable. No pancreatic  ductal dilatation or surrounding inflammatory changes. Spleen: Normal in size without significant abnormality. Adrenals/Urinary Tract: Adrenal glands are unremarkable. Kidneys are normal, without renal calculi, solid lesion, or hydronephrosis. Bladder is unremarkable. Stomach/Bowel: Status post partial sleeve gastrectomy. Appendix appears normal. No evidence of bowel wall thickening, distention, or inflammatory changes. Sigmoid diverticula. Vascular/Lymphatic: Scattered aortic atherosclerosis. No enlarged abdominal or pelvic lymph nodes. Reproductive: No mass or other abnormality. Other: No abdominal wall hernia or abnormality. No ascites. Musculoskeletal: No acute osseous findings. CT LUMBAR SPINE FINDINGS Alignment: Normal lumbar lordosis. Vertebral bodies: Intact. No fracture or  dislocation. Disc spaces: Intact. Paraspinous soft tissues: Unremarkable. IMPRESSION: 1. No acute noncontrast CT findings of the abdomen or pelvis. 2. No fracture or dislocation of the lumbar spine. Disc spaces and vertebral body heights are preserved. 3. Status post partial sleeve gastrectomy. 4. Sigmoid diverticulosis without evidence of acute diverticulitis. 5. Aortic atherosclerosis, advanced for patient age. Aortic Atherosclerosis (ICD10-I70.0). Electronically Signed   By: Delanna Ahmadi M.D.   On: 07/07/2022 20:59   CT L-SPINE NO CHARGE  Result Date: 07/07/2022 CLINICAL DATA:  Low back pain for 2 weeks EXAM: CT ABDOMEN AND PELVIS WITHOUT CONTRAST CT LUMBAR SPINE WITHOUT CONTRAST TECHNIQUE: Multidetector CT imaging of the abdomen and pelvis was performed following the standard protocol without IV contrast. Multidetector CT imaging of the lumbar spine was performed following the standard protocol without IV contrast. RADIATION DOSE REDUCTION: This exam was performed according to the departmental dose-optimization program which includes automated exposure control, adjustment of the mA and/or kV according to patient size and/or use  of iterative reconstruction technique. COMPARISON:  None Available. FINDINGS: CT ABDOMEN PELVIS FINDINGS Lower chest: Minimal scarring or atelectasis of the left lung base. Hepatobiliary: No solid liver abnormality is seen. No gallstones, gallbladder wall thickening, or biliary dilatation. Pancreas: Unremarkable. No pancreatic ductal dilatation or surrounding inflammatory changes. Spleen: Normal in size without significant abnormality. Adrenals/Urinary Tract: Adrenal glands are unremarkable. Kidneys are normal, without renal calculi, solid lesion, or hydronephrosis. Bladder is unremarkable. Stomach/Bowel: Status post partial sleeve gastrectomy. Appendix appears normal. No evidence of bowel wall thickening, distention, or inflammatory changes. Sigmoid diverticula. Vascular/Lymphatic: Scattered aortic atherosclerosis. No enlarged abdominal or pelvic lymph nodes. Reproductive: No mass or other abnormality. Other: No abdominal wall hernia or abnormality. No ascites. Musculoskeletal: No acute osseous findings. CT LUMBAR SPINE FINDINGS Alignment: Normal lumbar lordosis. Vertebral bodies: Intact. No fracture or dislocation. Disc spaces: Intact. Paraspinous soft tissues: Unremarkable. IMPRESSION: 1. No acute noncontrast CT findings of the abdomen or pelvis. 2. No fracture or dislocation of the lumbar spine. Disc spaces and vertebral body heights are preserved. 3. Status post partial sleeve gastrectomy. 4. Sigmoid diverticulosis without evidence of acute diverticulitis. 5. Aortic atherosclerosis, advanced for patient age. Aortic Atherosclerosis (ICD10-I70.0). Electronically Signed   By: Delanna Ahmadi M.D.   On: 07/07/2022 20:59    Procedures Procedures    Medications Ordered in ED Medications  ketorolac (TORADOL) 15 MG/ML injection 15 mg (has no administration in time range)  lidocaine (LIDODERM) 5 % 1 patch (has no administration in time range)    ED Course/ Medical Decision Making/ A&P                              Medical Decision Making Amount and/or Complexity of Data Reviewed Labs: ordered. Radiology: ordered.  Risk Prescription drug management.   Brenda Michael is here with left lower back pain.  Normal vitals.  No fever.  History of blood clot on Eliquis.  Left lower back pain.  Differential diagnosis likely MSK/sciatic type pain but could be kidney stone or kidney infection.  Will get CBC, CMP, lipase, urinalysis, CT scan renal study.  Per my review interpretation labs no significant anemia or electrolyte abnormality or kidney injury or leukocytosis.  Urine negative for infection.  CT scan per radiology report with no acute findings.  C test is negative.  There is no diverticulitis or bowel obstruction or kidney stone.  Recommend Medrol Dosepak, lidocaine patches, Flexeril for suspected muscle spasm, MSK related  issue.  Given Toradol and lidocaine patch in the ED.  She is neurovascular neuromuscular intact on exam.  No concern for cauda equina.  Will have her follow-up with primary care doctor.  Discharged in good condition.  This chart was dictated using voice recognition software.  Despite best efforts to proofread,  errors can occur which can change the documentation meaning.         Final Clinical Impression(s) / ED Diagnoses Final diagnoses:  Acute left-sided low back pain without sciatica    Rx / DC Orders ED Discharge Orders          Ordered    methylPREDNISolone (MEDROL DOSEPAK) 4 MG TBPK tablet        07/07/22 2116    cyclobenzaprine (FLEXERIL) 10 MG tablet  2 times daily PRN        07/07/22 2116    lidocaine (LIDODERM) 5 %  Every 24 hours        07/07/22 2116              Lennice Sites, DO 07/07/22 2117

## 2022-07-07 NOTE — ED Triage Notes (Signed)
Lower back pain x 2 weeks. Reports having outpatient Xrs performed after seeing her PCP for same sx, but did not get results. Denies radiation of pain. Describes pain as constant. Patient ambulatory with steady gait in triage.

## 2023-05-31 ENCOUNTER — Emergency Department (HOSPITAL_BASED_OUTPATIENT_CLINIC_OR_DEPARTMENT_OTHER)
Admission: EM | Admit: 2023-05-31 | Discharge: 2023-05-31 | Disposition: A | Discharge status: Home / Self Care | Payer: Self-pay | Attending: Emergency Medicine | Admitting: Emergency Medicine

## 2023-05-31 ENCOUNTER — Other Ambulatory Visit: Payer: Self-pay

## 2023-05-31 ENCOUNTER — Emergency Department (HOSPITAL_BASED_OUTPATIENT_CLINIC_OR_DEPARTMENT_OTHER): Payer: Self-pay

## 2023-05-31 ENCOUNTER — Encounter (HOSPITAL_BASED_OUTPATIENT_CLINIC_OR_DEPARTMENT_OTHER): Payer: Self-pay | Admitting: Emergency Medicine

## 2023-05-31 DIAGNOSIS — Z79899 Other long term (current) drug therapy: Secondary | ICD-10-CM | POA: Insufficient documentation

## 2023-05-31 DIAGNOSIS — I10 Essential (primary) hypertension: Secondary | ICD-10-CM | POA: Insufficient documentation

## 2023-05-31 DIAGNOSIS — Z7901 Long term (current) use of anticoagulants: Secondary | ICD-10-CM | POA: Insufficient documentation

## 2023-05-31 DIAGNOSIS — R42 Dizziness and giddiness: Secondary | ICD-10-CM | POA: Insufficient documentation

## 2023-05-31 DIAGNOSIS — R519 Headache, unspecified: Secondary | ICD-10-CM | POA: Insufficient documentation

## 2023-05-31 LAB — CBC
HCT: 41.3 % (ref 36.0–46.0)
Hemoglobin: 13.3 g/dL (ref 12.0–15.0)
MCH: 29.7 pg (ref 26.0–34.0)
MCHC: 32.2 g/dL (ref 30.0–36.0)
MCV: 92.2 fL (ref 80.0–100.0)
Platelets: 225 10*3/uL (ref 150–400)
RBC: 4.48 MIL/uL (ref 3.87–5.11)
RDW: 13.1 % (ref 11.5–15.5)
WBC: 5.2 10*3/uL (ref 4.0–10.5)
nRBC: 0 % (ref 0.0–0.2)

## 2023-05-31 LAB — BASIC METABOLIC PANEL
Anion gap: 5 (ref 5–15)
BUN: 7 mg/dL (ref 6–20)
CO2: 29 mmol/L (ref 22–32)
Calcium: 8.6 mg/dL — ABNORMAL LOW (ref 8.9–10.3)
Chloride: 102 mmol/L (ref 98–111)
Creatinine, Ser: 0.8 mg/dL (ref 0.44–1.00)
GFR, Estimated: >60 mL/min (ref >60)
Glucose, Bld: 85 mg/dL (ref 70–99)
Potassium: 3.8 mmol/L (ref 3.5–5.1)
Sodium: 136 mmol/L (ref 135–145)

## 2023-05-31 LAB — PREGNANCY, URINE: Preg Test, Ur: NEGATIVE

## 2023-05-31 LAB — URINALYSIS, ROUTINE W REFLEX MICROSCOPIC
Bilirubin Urine: NEGATIVE
Glucose, UA: NEGATIVE mg/dL
Hgb urine dipstick: NEGATIVE
Ketones, ur: NEGATIVE mg/dL
Leukocytes,Ua: NEGATIVE
Nitrite: NEGATIVE
Protein, ur: NEGATIVE mg/dL
Specific Gravity, Urine: 1.02 (ref 1.005–1.030)
pH: 8.5 — ABNORMAL HIGH (ref 5.0–8.0)

## 2023-05-31 MED ORDER — KETOROLAC TROMETHAMINE 15 MG/ML IJ SOLN
15.0000 mg | Freq: Once | INTRAMUSCULAR | Status: AC
  Administered 2023-05-31: 15 mg via INTRAVENOUS
  Filled 2023-05-31: qty 1

## 2023-05-31 MED ORDER — PROCHLORPERAZINE EDISYLATE 10 MG/2ML IJ SOLN
10.0000 mg | Freq: Once | INTRAMUSCULAR | Status: AC
  Administered 2023-05-31: 10 mg via INTRAVENOUS
  Filled 2023-05-31: qty 2

## 2023-05-31 MED ORDER — DIPHENHYDRAMINE HCL 50 MG/ML IJ SOLN
25.0000 mg | Freq: Once | INTRAMUSCULAR | Status: AC
  Administered 2023-05-31: 25 mg via INTRAVENOUS
  Filled 2023-05-31: qty 1

## 2023-05-31 MED ORDER — SODIUM CHLORIDE 0.9 % IV BOLUS
1000.0000 mL | Freq: Once | INTRAVENOUS | Status: AC
  Administered 2023-05-31: 1000 mL via INTRAVENOUS

## 2023-05-31 MED ORDER — MAGNESIUM SULFATE 2 GM/50ML IV SOLN
2.0000 g | Freq: Once | INTRAVENOUS | Status: AC
  Administered 2023-05-31: 2 g via INTRAVENOUS
  Filled 2023-05-31: qty 50

## 2023-05-31 NOTE — Discharge Instructions (Signed)
 Please use Tylenol  or ibuprofen  for pain.  You may use 600 mg ibuprofen  every 6 hours or 1000 mg of Tylenol  every 6 hours.  You may choose to alternate between the 2.  This would be most effective.  Not to exceed 4 g of Tylenol  within 24 hours.  Not to exceed 3200 mg ibuprofen  24 hours.  Please follow-up with your regular doctor tomorrow, if you have continued worsening headache and follow-up with the neurologist's contact formation provided above.

## 2023-05-31 NOTE — ED Triage Notes (Signed)
 Headache x 2 weeks , dizziness x 3 days . Hx HTN . Left arm weakness x 2 days . Blurry vision x 2 days .  Alert and oriented x 4 , ambulatory with steady gait .

## 2023-05-31 NOTE — ED Provider Notes (Signed)
 [place] [facility] Provider Note   CSN: [id number] Arrival date & time: 05/31/23  1336     History  Chief Complaint  Patient presents with   Dizziness   Headache    Brenda Michael is a 48 y.o. 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  She denies any visual field cuts, double vision.  She denies any nausea, vomiting, confusion.  She does report that she has had a history of migraines in the past but this feels somewhat dissimilar.  She denies any light sensitivity.   Dizziness Associated symptoms: headaches   Headache Associated symptoms: dizziness        Home Medications Prior to Admission medications   Medication Sig Start Date End Date Taking? Authorizing Provider  amLODipine (NORVASC) 5 MG tablet  03/09/18   [provider]  apixaban  (ELIQUIS ) 5 MG TABS tablet Take 10 mg (2 tabs) twice daily for 7 days, then take 5 mg (1 tab) twice daily 10/15/17   Danford, Lonni SQUIBB, MD  cyclobenzaprine  (FLEXERIL ) 10 MG tablet Take 1 tablet (10 mg total) by mouth 2 (two) times daily as needed for muscle spasms. 07/07/22   Curatolo, Adam, DO  famotidine  (PEPCID ) 20 MG tablet Take 1 tablet (20 mg total) by mouth daily. 12/29/17   Dean Clarity, MD  famotidine  (PEPCID ) 20 MG tablet Take 1 tablet (20 mg total) by mouth 2 (two) times daily. 07/08/18   Armenta Canning, MD  levonorgestrel (MIRENA) 20 MCG/24HR IUD by Intrauterine route. 01/29/18   [provider]  lidocaine  (LIDODERM ) 5 % Place 1 patch onto the skin daily. Remove & Discard patch within 12 hours or  as directed by MD 07/07/22   Ruthe Cornet, DO  methylPREDNISolone  (MEDROL  DOSEPAK) 4 MG TBPK tablet Follow package insert 07/07/22   Curatolo, Adam, DO  metoprolol  succinate (TOPROL -XL) 25 MG 24 hr tablet Take 25 mg by mouth daily.    [provider]  naproxen (NAPROSYN) 500 MG tablet Take 500 mg by mouth 2 (two) times daily. 12/01/18   [provider]  NIFEdipine  (PROCARDIA -XL/ADALAT  CC) 60 MG 24 hr tablet Take 1 tablet (60 mg total) by mouth daily. 10/15/17   Danford, Lonni SQUIBB, MD  traMADol  (ULTRAM ) 50 MG tablet TK 1 T PO Q 6 H PRF MENSTRUAL CRAMP PAIN. 06/17/18   [provider]  traMADol  (ULTRAM ) 50 MG tablet 1-2 tablets every 6 hours as needed for pain. 07/08/18   Armenta Canning, MD  traZODone  (DESYREL ) 50 MG tablet Take 50 mg by mouth at bedtime as needed for sleep.    [provider]      Allergies    Iodinated contrast media and Other    Review of Systems   Review of Systems  Neurological:  Positive for dizziness and headaches.  All other systems reviewed and are negative.   Physical Exam Updated Vital Signs BP (!) 159/97 (BP Location: Left Arm)   Pulse (!) 58   Temp 98.1 F (36.7 C) (Oral)   Resp 18   Wt 97.1 kg   LMP 04/26/2023 (Approximate)   SpO2 100%  BMI 29.85 kg/m  Physical Exam Vitals and nursing note reviewed.  Constitutional:      General: She is not in acute distress.    Appearance: Normal appearance.  HENT:     Head: Normocephalic and atraumatic.  Eyes:     General:        Right eye: No discharge.        Left eye: No discharge.  Cardiovascular:     Rate and Rhythm: Normal rate and regular rhythm.     Heart sounds: No murmur heard.    No friction rub. No gallop.  Pulmonary:     Effort: Pulmonary effort is normal.     Breath sounds: Normal breath sounds.  Abdominal:     General: Bowel sounds are normal.     Palpations: Abdomen is soft.  Skin:    General: Skin is warm and dry.     Capillary Refill: Capillary  refill takes less than 2 seconds.  Neurological:     Mental Status: She is alert and oriented to person, place, and time.     Comments: Cranial nerves II through XII grossly intact.  Intact finger-nose, intact heel-to-shin.  Romberg negative, gait normal.  Alert and oriented x3.  Moves all 4 limbs spontaneously, normal coordination.  No pronator drift.  Intact strength 5 out of 5 bilateral upper and lower extremities.    Psychiatric:        Mood and Affect: Mood normal.        Behavior: Behavior normal.     ED Results / Procedures / Treatments   Labs (all labs ordered are listed, but only abnormal results are displayed) Labs Reviewed  BASIC METABOLIC PANEL - Abnormal; Notable for the following components:      Result Value   Calcium 8.6 (*)    All other components within normal limits  URINALYSIS, ROUTINE W REFLEX MICROSCOPIC - Abnormal; Notable for the following components:   pH 8.5 (*)    All other components within normal limits  CBC  PREGNANCY, URINE    EKG None  Radiology CT Head Wo Contrast Result Date: 05/31/2023 CLINICAL DATA:  Headache for 2 weeks. Dizziness for 3 days. Left arm weakness and blurred vision for 2 days. EXAM: CT HEAD WITHOUT CONTRAST TECHNIQUE: Contiguous axial images were obtained from the base of the skull through the vertex without intravenous contrast. RADIATION DOSE REDUCTION: This exam was performed according to the departmental dose-optimization program which includes automated exposure control, adjustment of the mA and/or kV according to patient size and/or use of iterative reconstruction technique. COMPARISON:  Report of CT head without contrast 01/01/2017. Images are not currently available. FINDINGS: Brain: No acute infarct, hemorrhage, or mass lesion is present. Deep brain nuclei are within normal limits. The ventricles are of normal size. No significant extraaxial fluid collection is present. The brainstem and cerebellum are within normal limits.  Midline structures are within normal limits. Vascular: No hyperdense vessel or unexpected calcification. Skull: Calvarium is intact. No focal lytic or blastic lesions are present. No significant extracranial soft tissue lesion is present. Sinuses/Orbits: The paranasal sinuses and mastoid air cells are clear. The globes and orbits are within normal limits. IMPRESSION: Normal CT appearance of the brain. Electronically Signed   By: Lonni Necessary M.D.   On: 05/31/2023 15:04    Procedures Procedures    Medications Ordered in ED Medications  sodium chloride  0.9 % bolus 1,000 mL (1,000 mLs Intravenous New Bag/Given 05/31/23 1424)  ketorolac  (TORADOL ) 15 MG/ML  injection 15 mg (15 mg Intravenous Given 05/31/23 1421)  prochlorperazine  (COMPAZINE ) injection 10 mg (10 mg Intravenous Given 05/31/23 1418)  diphenhydrAMINE  (BENADRYL ) injection 25 mg (25 mg Intravenous Given 05/31/23 1416)  magnesium  sulfate IVPB 2 g 50 mL (0 g Intravenous Stopped 05/31/23 1500)    ED Course/ Medical Decision Making/ A&P                                 Medical Decision Making Amount and/or Complexity of Data Reviewed Labs: ordered. Radiology: ordered.  Risk Prescription drug management.   This patient is a 48 y.o. female  who presents to the ED for concern of headache, dizziness, vision changes.   Differential diagnoses prior to evaluation: The emergent differential diagnosis includes, but is not limited to,  Stroke, increased ICP, meningitis, CVA, intracranial tumor, venous sinus thrombosis, migraine, cluster headache, hypertension, drug related, head injury, tension headache, sinusitis, dental abscess, otitis media, TMJ . This is not an exhaustive differential.   Past Medical History / Co-morbidities / Social History: Hypertension, otherwise noncontributory  Physical Exam: Physical exam performed. The pertinent findings include: Patient with no focal neurologic deficits on exam.  He is mildly hypertensive, blood  pressure 159/97, vital signs otherwise stable.  Lab Tests/Imaging studies: I personally interpreted labs/imaging and the pertinent results include: BMP unremarkable, UA unremarkable, CBC unremarkable, pregnancy test negative.  I independently turbid CT head without contrast which shows no evidence of acute intracranial abnormality to explain patient's symptoms.. I agree with the radiologist interpretation.  Cardiac monitoring: EKG obtained and interpreted by myself and attending physician which shows: Normal sinus rhythm   Medications: I ordered medication including migraine cocktail for atypical headache symptoms.  I have reviewed the patients home medicines and have made adjustments as needed.   Disposition: After consideration of the diagnostic results and the patients response to treatment, I feel that patient with improvement of headache, dizziness, her only lingering symptom is her atypical intermittent tingling in her left arm.  Given symptoms for several days, and improvement of her headache, dizziness, blurry vision on reevaluation I do not think emergent transfer for MRI to rule out MS or other atypical neurologic abnormality is warranted at this time, I recommend further outpatient follow-up as patient has already planned.   emergency department workup does not suggest an emergent condition requiring admission or immediate intervention beyond what has been performed at this time. The plan is: as above. The patient is safe for discharge and has been instructed to return immediately for worsening symptoms, change in symptoms or any other concerns.  Final Clinical Impression(s) / ED Diagnoses Final diagnoses:  Acute nonintractable headache, unspecified headache type  Dizziness    Rx / DC Orders ED Discharge Orders     None         Rosan Sherlean VEAR DEVONNA 05/31/23 1550    Dreama Longs, MD 05/31/23 2325

## 2023-12-25 LAB — COLOGUARD: COLOGUARD: NEGATIVE
